# Patient Record
Sex: Female | Born: 1945 | Race: Black or African American | Hispanic: No | Smoking: Never smoker
Health system: Southern US, Community
[De-identification: ages and names within clinical notes are randomized; demographics above are authoritative.]

## PROBLEM LIST (undated history)

## (undated) DIAGNOSIS — M1990 Unspecified osteoarthritis, unspecified site: Secondary | ICD-10-CM

## (undated) DIAGNOSIS — I671 Cerebral aneurysm, nonruptured: Secondary | ICD-10-CM

## (undated) DIAGNOSIS — I10 Essential (primary) hypertension: Secondary | ICD-10-CM

## (undated) DIAGNOSIS — E039 Hypothyroidism, unspecified: Secondary | ICD-10-CM

## (undated) DIAGNOSIS — I729 Aneurysm of unspecified site: Secondary | ICD-10-CM

## (undated) HISTORY — PX: EYE SURGERY: SHX253

## (undated) HISTORY — PX: ANEURYSM COILING: SHX5349

## (undated) HISTORY — PX: JOINT REPLACEMENT: SHX530

---

## 2000-01-03 ENCOUNTER — Encounter: Admission: RE | Admit: 2000-01-03 | Discharge: 2000-01-03 | Payer: Self-pay | Admitting: Family Medicine

## 2000-01-03 ENCOUNTER — Encounter: Payer: Self-pay | Admitting: Family Medicine

## 2000-04-02 ENCOUNTER — Encounter: Payer: Self-pay | Admitting: Family Medicine

## 2000-04-02 ENCOUNTER — Encounter: Admission: RE | Admit: 2000-04-02 | Discharge: 2000-04-02 | Payer: Self-pay | Admitting: Family Medicine

## 2000-04-24 ENCOUNTER — Ambulatory Visit (HOSPITAL_COMMUNITY): Admission: RE | Admit: 2000-04-24 | Discharge: 2000-04-24 | Payer: Self-pay | Admitting: Neurosurgery

## 2000-06-25 ENCOUNTER — Inpatient Hospital Stay (HOSPITAL_COMMUNITY): Admission: RE | Admit: 2000-06-25 | Discharge: 2000-06-27 | Payer: Self-pay | Admitting: Interventional Radiology

## 2001-05-11 ENCOUNTER — Encounter: Payer: Self-pay | Admitting: Family Medicine

## 2001-05-11 ENCOUNTER — Encounter: Admission: RE | Admit: 2001-05-11 | Discharge: 2001-05-11 | Payer: Self-pay | Admitting: Family Medicine

## 2001-06-30 ENCOUNTER — Ambulatory Visit (HOSPITAL_COMMUNITY): Admission: RE | Admit: 2001-06-30 | Discharge: 2001-06-30 | Payer: Self-pay | Admitting: Interventional Radiology

## 2001-08-30 ENCOUNTER — Ambulatory Visit (HOSPITAL_COMMUNITY): Admission: RE | Admit: 2001-08-30 | Discharge: 2001-08-30 | Payer: Self-pay | Admitting: Neurosurgery

## 2002-05-27 ENCOUNTER — Encounter: Admission: RE | Admit: 2002-05-27 | Discharge: 2002-05-27 | Payer: Self-pay | Admitting: Family Medicine

## 2002-05-27 ENCOUNTER — Encounter: Payer: Self-pay | Admitting: Family Medicine

## 2002-06-08 ENCOUNTER — Encounter (INDEPENDENT_AMBULATORY_CARE_PROVIDER_SITE_OTHER): Payer: Self-pay | Admitting: Specialist

## 2002-06-08 ENCOUNTER — Ambulatory Visit (HOSPITAL_BASED_OUTPATIENT_CLINIC_OR_DEPARTMENT_OTHER): Admission: RE | Admit: 2002-06-08 | Discharge: 2002-06-08 | Payer: Self-pay | Admitting: Orthopedic Surgery

## 2003-06-29 ENCOUNTER — Encounter: Payer: Self-pay | Admitting: Family Medicine

## 2003-06-29 ENCOUNTER — Encounter: Admission: RE | Admit: 2003-06-29 | Discharge: 2003-06-29 | Payer: Self-pay | Admitting: Family Medicine

## 2003-10-05 ENCOUNTER — Ambulatory Visit (HOSPITAL_COMMUNITY): Admission: RE | Admit: 2003-10-05 | Discharge: 2003-10-05 | Payer: Self-pay | Admitting: Gastroenterology

## 2004-07-17 ENCOUNTER — Encounter: Admission: RE | Admit: 2004-07-17 | Discharge: 2004-07-17 | Payer: Self-pay | Admitting: Family Medicine

## 2005-06-11 ENCOUNTER — Other Ambulatory Visit: Admission: RE | Admit: 2005-06-11 | Discharge: 2005-06-11 | Payer: Self-pay | Admitting: Family Medicine

## 2005-08-24 ENCOUNTER — Encounter: Admission: RE | Admit: 2005-08-24 | Discharge: 2005-08-24 | Payer: Self-pay | Admitting: Neurosurgery

## 2005-09-08 ENCOUNTER — Encounter: Payer: Self-pay | Admitting: Interventional Radiology

## 2005-10-23 ENCOUNTER — Ambulatory Visit (HOSPITAL_COMMUNITY): Admit: 2005-10-23 | Discharge: 2005-10-24 | Payer: Self-pay | Admitting: Interventional Radiology

## 2005-11-10 ENCOUNTER — Encounter: Payer: Self-pay | Admitting: Interventional Radiology

## 2006-02-20 ENCOUNTER — Ambulatory Visit (HOSPITAL_COMMUNITY): Admission: RE | Admit: 2006-02-20 | Discharge: 2006-02-20 | Payer: Self-pay | Admitting: Interventional Radiology

## 2006-07-23 ENCOUNTER — Encounter: Admission: RE | Admit: 2006-07-23 | Discharge: 2006-07-23 | Payer: Self-pay | Admitting: Family Medicine

## 2006-09-23 ENCOUNTER — Inpatient Hospital Stay (HOSPITAL_COMMUNITY): Admission: RE | Admit: 2006-09-23 | Discharge: 2006-09-25 | Payer: Self-pay | Admitting: Orthopedic Surgery

## 2007-07-26 ENCOUNTER — Encounter: Admission: RE | Admit: 2007-07-26 | Discharge: 2007-07-26 | Payer: Self-pay | Admitting: Family Medicine

## 2008-02-22 ENCOUNTER — Other Ambulatory Visit: Admission: RE | Admit: 2008-02-22 | Discharge: 2008-02-22 | Payer: Self-pay | Admitting: Family Medicine

## 2008-07-26 ENCOUNTER — Encounter: Admission: RE | Admit: 2008-07-26 | Discharge: 2008-07-26 | Payer: Self-pay | Admitting: Family Medicine

## 2008-10-16 ENCOUNTER — Inpatient Hospital Stay (HOSPITAL_COMMUNITY): Admission: RE | Admit: 2008-10-16 | Discharge: 2008-10-20 | Payer: Self-pay | Admitting: Orthopedic Surgery

## 2008-10-19 ENCOUNTER — Ambulatory Visit: Payer: Self-pay | Admitting: Internal Medicine

## 2009-10-18 ENCOUNTER — Encounter: Admission: RE | Admit: 2009-10-18 | Discharge: 2009-10-18 | Payer: Self-pay | Admitting: Family Medicine

## 2010-10-21 ENCOUNTER — Encounter
Admission: RE | Admit: 2010-10-21 | Discharge: 2010-10-21 | Payer: Self-pay | Source: Home / Self Care | Attending: Family Medicine | Admitting: Family Medicine

## 2011-01-27 LAB — CBC
HCT: 26.6 % — ABNORMAL LOW (ref 36.0–46.0)
HCT: 27.6 % — ABNORMAL LOW (ref 36.0–46.0)
HCT: 29.1 % — ABNORMAL LOW (ref 36.0–46.0)
Hemoglobin: 9.1 g/dL — ABNORMAL LOW (ref 12.0–15.0)
Hemoglobin: 9.7 g/dL — ABNORMAL LOW (ref 12.0–15.0)
MCHC: 32.8 g/dL (ref 30.0–36.0)
MCHC: 33.4 g/dL (ref 30.0–36.0)
MCV: 91 fL (ref 78.0–100.0)
MCV: 92 fL (ref 78.0–100.0)
MCV: 94.1 fL (ref 78.0–100.0)
Platelets: 84 10*3/uL — ABNORMAL LOW (ref 150–400)
Platelets: 93 10*3/uL — ABNORMAL LOW (ref 150–400)
RBC: 2.92 MIL/uL — ABNORMAL LOW (ref 3.87–5.11)
RBC: 2.94 MIL/uL — ABNORMAL LOW (ref 3.87–5.11)
RDW: 13.5 % (ref 11.5–15.5)
RDW: 13.6 % (ref 11.5–15.5)
RDW: 14.6 % (ref 11.5–15.5)
WBC: 6.2 10*3/uL (ref 4.0–10.5)
WBC: 7.9 10*3/uL (ref 4.0–10.5)

## 2011-01-27 LAB — BASIC METABOLIC PANEL
BUN: 12 mg/dL (ref 6–23)
BUN: 8 mg/dL (ref 6–23)
CO2: 26 mEq/L (ref 19–32)
CO2: 27 mEq/L (ref 19–32)
Calcium: 8 mg/dL — ABNORMAL LOW (ref 8.4–10.5)
Calcium: 8.5 mg/dL (ref 8.4–10.5)
Calcium: 9 mg/dL (ref 8.4–10.5)
Chloride: 102 mEq/L (ref 96–112)
Chloride: 108 mEq/L (ref 96–112)
Creatinine, Ser: 0.89 mg/dL (ref 0.4–1.2)
GFR calc Af Amer: >60 mL/min (ref >60)
GFR calc Af Amer: >60 mL/min (ref >60)
GFR calc non Af Amer: 50 mL/min — ABNORMAL LOW (ref >60)
GFR calc non Af Amer: >60 mL/min (ref >60)
GFR calc non Af Amer: >60 mL/min (ref >60)
Glucose, Bld: 107 mg/dL — ABNORMAL HIGH (ref 70–99)
Glucose, Bld: 116 mg/dL — ABNORMAL HIGH (ref 70–99)
Glucose, Bld: 124 mg/dL — ABNORMAL HIGH (ref 70–99)
Potassium: 3.4 mEq/L — ABNORMAL LOW (ref 3.5–5.1)
Potassium: 4.2 mEq/L (ref 3.5–5.1)
Sodium: 134 mEq/L — ABNORMAL LOW (ref 135–145)
Sodium: 134 mEq/L — ABNORMAL LOW (ref 135–145)
Sodium: 137 mEq/L (ref 135–145)
Sodium: 141 mEq/L (ref 135–145)

## 2011-01-27 LAB — GLUCOSE, CAPILLARY
Glucose-Capillary: 119 mg/dL — ABNORMAL HIGH (ref 70–99)
Glucose-Capillary: 119 mg/dL — ABNORMAL HIGH (ref 70–99)

## 2011-01-27 LAB — CULTURE, BLOOD (ROUTINE X 2): Culture: NO GROWTH

## 2011-01-27 LAB — TYPE AND SCREEN
ABO/RH(D): A POS
Antibody Screen: NEGATIVE

## 2011-01-27 LAB — DIC (DISSEMINATED INTRAVASCULAR COAGULATION)PANEL
D-Dimer, Quant: 4.9 ug/mL-FEU — ABNORMAL HIGH (ref 0.00–0.48)
Prothrombin Time: 18.3 seconds — ABNORMAL HIGH (ref 11.6–15.2)
aPTT: 44 seconds — ABNORMAL HIGH (ref 24–37)

## 2011-01-27 LAB — PROTIME-INR
INR: 1.4 (ref 0.00–1.49)
INR: 1.5 (ref 0.00–1.49)
INR: 1.7 — ABNORMAL HIGH (ref 0.00–1.49)
INR: 1.9 — ABNORMAL HIGH (ref 0.00–1.49)
Prothrombin Time: 17.4 seconds — ABNORMAL HIGH (ref 11.6–15.2)

## 2011-01-27 LAB — URINE CULTURE: Colony Count: 15000

## 2011-01-27 LAB — HEPARIN ANTIBODY SCREEN: Heparin Antibody Screen: NEGATIVE

## 2011-01-27 LAB — URINALYSIS, ROUTINE W REFLEX MICROSCOPIC
Bilirubin Urine: NEGATIVE
Ketones, ur: NEGATIVE mg/dL
Nitrite: NEGATIVE
Urobilinogen, UA: 0.2 mg/dL (ref 0.0–1.0)
pH: 5.5 (ref 5.0–8.0)

## 2011-01-27 LAB — HEMOGLOBIN A1C: Mean Plasma Glucose: 97 mg/dL

## 2011-02-25 NOTE — Consult Note (Signed)
Meredith [name], Meredith [name]              ACCOUNT NO.:  [id number]   MEDICAL RECORD NO.:  [id number]          PATIENT TYPE:  INP   LOCATION:  5031                         FACILITY:  [facility]   PHYSICIAN:  Lajuana Matte, MD  DATE OF BIRTH:  12/28/1945   DATE OF CONSULTATION:  10/19/2008  DATE OF DISCHARGE:                                 CONSULTATION   REASON FOR CONSULTATION:  Thrombocytopenia.   HISTORY OF PRESENT ILLNESS:  Meredith [name] is a pleasant 65 year old  African American female admitted for right total knee replacement.  On  admission her platelet counts were 93,000 but started to decline down to  77,000 earlier today.  A CBC on October 10, 2008, demonstrated a  platelet count of 172,000.  She was started on Lovenox after the surgery  but this was discontinued 2 days ago and switched to Coumadin alone.  She is currently asymptomatic, except for an area of ecchymosis on the  right arm after starting IV access.  The patient has similar episodes of  thrombocytopenia after a left knee replacement in the year 2007 and she  recovered spontaneously.  Her HIT test was negative and her DIC panel is  essentially unremarkable.  We were asked to see her with recommendations  regarding her thrombocytopenia.   PAST MEDICAL HISTORY:  1. Prior history of cerebral aneurysm in the years 2006 and 2001.  2. Hypertension.  3. Hypothyroidism.  4. Obesity.  5. DJD - osteoarthritis.  6. Schizophrenia.   PAST SURGICAL HISTORY:  1. Status post left TKR in the year 2007, Dr. Thurston Hole.  2. Status post arterial coiling for cerebral aneurysm in the years      2006 and 2001.  3. Status post right TKR October 16, 2008, Dr. Thurston Hole.   ALLERGIES:  NKDA.   MEDICATIONS:  1. Avelox 400 mg IV daily, day 1.  2. Colace 100 mg b.i.d.  3. NovoLog as directed.  4. Synthroid 200 mg (sic) daily.  5. K-Dur 40 mEq b.i.d.  6. Zocor 80 mg daily.  7. Robaxin 500 mg q.6 h p.r.n.  8. Percocet q.4 h p.r.n.  9. Phenergan  12.5 mg q.6 hours p.r.n.  10.Senokot p.r.n.  11.Temazepam 15-30 mg q.h.s. p.r.n.   REVIEW OF SYSTEMS:  Remarkable for fever up to 101.2 as of January 5,  now improved.  No chills.  No night sweats.  No headaches.  She denies  any increased confusion.  No vision changes.  She denies any dysphagia,  dyspnea on exertion, shortness of breath or cough.  No abdominal pain.  No failure to thrive or weight loss.  No GERD symptoms.  No nausea,  vomiting, diarrhea or constipation.  No blood in the stools.  No dark  stools.  Denies any back pain.  Denies any numbness or swelling.  She  has some fatigue.  Of note, she fell in the hospital, in the area of  opening incision on October 18, 2008, for which she is at this time  tender in the area, as well as swollen.   FAMILY HISTORY:  Mother's history unknown to the patient,  father died  with MI.  She has one sibling who died with cancer of unknown type and  one sister who died of kidney disease.   SOCIAL HISTORY:  The patient is married.  She has one child.  No tobacco  or alcohol history.  She is retired from [employer] school system.  Lives in [place].  [employer].  On health maintenance, her last  colonoscopy was performed by Dr. Danise Edge in 2004 and was  negative.  Last mammogram October 2009, negative.   PHYSICAL EXAMINATION:  GENERAL:  This is a 65 year old African American  female in no acute distress, alert and oriented x3.  Looks older than  her stated age.  VITAL SIGNS:  Blood pressure 118/63, pulse 96, respirations 20,  temperature 99.4, pulse oximetry 89% on room air, changed to 92% on 2  liters, weight 104 kilograms, height 62 inches.  HEENT:  Normocephalic, atraumatic.  PERRLA.  Oral mucosa without thrush  or lesions.  NECK:  Supple.  No cervical or supraclavicular masses.  LUNGS:  Clear to auscultation with the exception of a trace of crackles  at the bases.  No axillary masses.  CARDIOVASCULAR:  Regular rate and rhythm  without murmurs, rubs or  gallops, occasional ectopic beat.  ABDOMEN:  Soft, nontender.  Bowel sounds x4.  No hepatosplenomegaly.  EXTREMITIES:  With no clubbing or cyanosis.  No significant edema in the  nonsurgical leg.  Mild swelling in the right knee area where the surgery  took place.  There is some bruising in the venipuncture site but no  petechial rash.  No other areas of acute bleeding.  BREASTS:  Not examined.  GU:  Deferred.  RECTAL:  Deferred.  MUSCULOSKELETAL:  With no spinal tenderness.  NEUROLOGICAL:  Essentially nonfocal, the patient is walking with  assistance now secondary to the postsurgical changes.  However, she can  move all four extremities and can follow command without difficulty.   LABS:  Hemoglobin 9.7, hematocrit 29.1, white count 10.1, platelets 77,  MCV 92.  ANC white count of 3.8 with 2.4, monocytes 0.2, lymphocytes  1.2.  LDH is 226 normal.  DIC shows a platelet count of 87.  No  schistocytes seen.  Fibrinogen 621, D-dimer 490, PTT is 44, INR 1.5, PT  18.3.  The HIT screen today is negative.  Sodium 137, potassium 5.0, BUN  9, creatinine 0.94, glucose 107, total bilirubin 1.4, ALT 88, AST 24,  ALT 21, total protein 7.3.  Albumin 4.0, calcium 9.0.  HIT screen  negative.  A1c 5.0.  Urinalysis negative.  Blood cultures negative.  Chest x-ray on December 29 negative.   ASSESSMENT AND PLAN:  Dr. Arbutus Ped has seen and evaluated the patient and  reviewed the chart.  This is a pleasant 65 year old woman we are asked  to see for evaluation of thrombocytopenia in the setting of a right knee  replacement on October 16, 2008.  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  Recommend observation for now.  Monitor the platelet  counts daily until her counts  are greater than 100,000.  If the platelet count continues to decline  will re-evaluate the patient back and proceed with further   recommendations.   Thank you very much for allowing [place] the opportunity to participate in  the care of Meredith [name].      Marlowe Kays, P.A.      Lajuana Matte, MD  Electronically Signed  SW/MEDQ  D:  10/19/2008  T:  10/19/2008  Job:  [id number]

## 2011-02-25 NOTE — Op Note (Signed)
NAMESHARRON, Meredith [name]              ACCOUNT NO.:  [id number]   MEDICAL RECORD NO.:  [id number]          PATIENT TYPE:  INP   LOCATION:  2899                         FACILITY:  [facility]   PHYSICIAN:  Elana Alm. Thurston Hole, M.D. DATE OF BIRTH:  08/16/1946   DATE OF PROCEDURE:  10/16/2008  DATE OF DISCHARGE:                               OPERATIVE REPORT   PREOPERATIVE DIAGNOSIS:  Right knee degenerative joint disease.   POSTOPERATIVE DIAGNOSIS:  Right knee degenerative joint disease.   PROCEDURES:  1. Right total knee replacement using DePuy cemented total knee system      with #3 MTB.  2. Cemented tibia with 5-mm augmentations.  3. Cemented femur.  4. A 15-mm polyethelene RP tibial spacer.  5. A 35-mm polyethelene cemented patella.   SURGEON:  Elana Alm. Thurston Hole, MD   ASSISTANT:  Julien Girt, PA   ANESTHESIA:  General.   OPERATIVE TIME:  One hour and 45 minutes.   COMPLICATIONS:  None.   DESCRIPTION OF PROCEDURE:  Meredith [name] was brought to the operating room  on October 16, 2008, after a femoral nerve block was placed in the  holding area by Anesthesia.  She was placed on the operating table in  supine position.  After being placed under general anesthesia, she had a  Foley catheter placed under sterile conditions.  She received Ancef 2 g  IV preoperatively for prophylaxis.  Her right knee was examined.  Range  of motion from -5 to 115 degrees with moderate varus deformity and a  stable ligamentous exam with normal patellar tracking.  The right leg  was prepped using sterile DuraPrep and draped using sterile technique.  Time-out was used to identify the correct leg, surgeon, and procedure.  The right leg was then exsanguinated and a thigh tourniquet elevated to  375 mm.  Initially, through a 15-cm longitudinal incision based over the  patella, initial exposure was made.  The underlying subcutaneous tissues  were incised along the skin incision.  A median arthrotomy was  performed  revealing an excessive amount of normal-appearing joint fluid.  The  articular surfaces were inspected.  She had grade 4 changes medially,  laterally, and in the patellofemoral joint with significant erosion on  the medial tibial plateau.  Large osteophytes removed from the femoral  condyles and tibial plateau.  There were significant hypertrophic almost  rheumatoid-appearing synovitis in the suprapatellar pouch and medial and  lateral gutters and this was slowly resected.  After this was done, an  intramedullary drill was drilled up the femoral canal for placement of  distal femoral cutting jig, which was placed in the appropriate amount  of rotation and a distal 13-mm cut was made.  The distal femur was then  sized.  The #3 was found to be the appropriate size.  The #3 cutting jig  was placed in the appropriate amount of external rotation and then these  cuts were made.  Proximal tibia was then exposed.  The tibial spines  were removed with an oscillating saw.  An intramedullary drill was  drilled down the tibial canal for placement of  proximal tibial cutting  jig, which was placed in the appropriate amount of rotation and a  proximal 6-mm cut was made, based off the medial or lower side taking  approximately 13 or 14-mm off the lateral side.  Spacer blocks were  placed in flexion and extension.  A 17-mm blocks gave excellent  stability and excellent correction of her flexion and varus deformities.  At this point, the proximal tibia was exposed and then a #3 MTB drill  was used to drill the proximal tibia.  This MTB sized and the tibia was  used due to the significant medial erosion and the fact that there had  to be significant increased lateral tibial bone removed to gain  balancing and stability.  After this was done, then the PCL box cutter  was placed on the distal femur and these cuts were made.  At this point,  the #3 tibial baseplate with 5-mm augments was used to  raise a joint  line upto normal and then the #3 femoral component trial was placed with  a 15-mm polyethelene RP tibial spacer, the knee was reduced taken  through range of motion from 0 to 120 degrees with excellent stability  and excellent correction of her flexion and varus deformities.  Normal  patellar tracking noted as well.  Resurfacing 9-mm cut was then made and  3 locking holes placed for a 35-mm patella.  The patellar trial was in  place and again patellofemoral tracking was evaluated and found to be  normal.  As this was done, it was felt that all the trial components  were of excellent size, fit, and stability.  They were then removed.  The knee was then jet lavaged, irrigated with 3 L of saline.  The  proximal tibia was then exposed and a #3 MTB tibia with 5-mm augments  with Zinacef 1.5 g antibiotic cement was placed and then the tibia  hammered in position with an excellent fit with excess cement being  removed from around the edges.  The #3 femoral component with Zinacef  enhanced cement was placed and hammered into position with an excellent  fit also with excess cement being removed from around the edges.  The 15-  mm polyethylene RP tibial spacer was placed on the tibial base plate.  The knee was reduced take through range of motion from 0 to 120 degrees  with excellent stability and excellent correction of her flexion and  varus deformities.  The 35-mm polyethylene cement backed patella was  then placed in this position and held there with a clamp.  After the  cement hardened, the patellofemoral tracking was evaluated and found to  be normal.  At this point, it was felt that all the components were of  excellent size, fit, and stability.  The wound was further irrigated  with saline.  The tourniquet was then released.  Hemostasis obtained  with cautery.  The arthrotomy was then closed with #1 Ethibond suture  over 2 medium Hemovac drains.  Subcutaneous tissue is closed  with 0 and  2-0 Vicryl, subcuticular layer closed with 4-0 Monocryl.  Sterile  dressings and a long leg splint were then applied.  The patient then  awakened, extubated, and taken to recovery room in stable condition.  Needle and sponge counts correct x2 at the end of case.  Neurovascular  status normal postoperatively as well.      Robert A. Thurston Hole, M.D.  Electronically Signed     RAW/MEDQ  D:  10/16/2008  T:  10/17/2008  Job:  [id number]

## 2011-02-28 NOTE — Op Note (Signed)
NAME:  Meredith [name], Meredith [name]                        ACCOUNT NO.:  [id number]   MEDICAL RECORD NO.:  [id number]                   PATIENT TYPE:  AMB   LOCATION:  DSC                                  FACILITY:  [facility]   PHYSICIAN:  Artist Pais. Mina Marble, M.D.           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   PROCEDURE:  Excisional biopsy mass left index finger.   SURGEON:  Artist Pais. Mina Marble, M.D.   ASSISTANT:  R.N.   ANESTHESIA:  General.   TOURNIQUET TIME:  Forty minutes.   COMPLICATIONS:  None.   DRAINS:  None.   DESCRIPTION OF PROCEDURE:  The patient was taken to the operating room.  After the induction of adequate general anesthesia the left upper extremity  was prepped and draped in the usual sterile fashion.  An Esmarch bandage was  used to exsanguinate the limb, the tourniquet was inflated to 350 mmHg. At  in this point in time a transverse incision was made starting on the dorsal  ulnar aspect of the index finger and going volarly all the way across to the  radial side.  The incision was taken out to the skin subcutaneous tissues.  Dissection was carried through the fascial layer until an obvious giant cell  tumor was encountered.  It had a brownish-orange color and texture  consistent with a probable giant cell tumor of the tendon sheath.  There was  an extremely large tumor involving both the radial and ulnar sides going  across the midline involving both neurovascular bundles.  Both neurovascular  bundles were carefully dissected free from the underlying mass which was  dissected down to the SPL tendon.  The size of the mass was 2 x 3 cm  minimum. After careful dissection the neurovascular bundle was again  identified and retracted.   After the mass was removed it was sent for  pathologic confirmation/diagnosis.  The wound was then thoroughly irrigated,  hemostasis was achieved with bipolar cautery and was closed with 5-0 nylon  with some of the excess skin being excised at wound closure.  The patient  was then placed in a sterile dressing with Xeroform, 4 x 4, fluffs and a  Coban wrap.  The patient tolerated the procedure well and was returned to  the recovery room in a stable fashion.                                                Artist Pais Mina Marble, M.D.    MAW/MEDQ  D:  06/08/2002  T:  06/10/2002  Job:  [id number]

## 2011-02-28 NOTE — Consult Note (Signed)
[name], Meredith [name]              ACCOUNT NO.:  [id number]   MEDICAL RECORD NO.:  [id number]          PATIENT TYPE:  OUT   LOCATION:  XRAY                         FACILITY:  [facility]   PHYSICIAN:  Sanjeev K. Deveshwar, M.D.DATE OF BIRTH:  1946-02-18   DATE OF CONSULTATION:  09/08/2005  DATE OF DISCHARGE:                                   CONSULTATION   ADDENDUM:  Greater than 30 minutes was spent on this consult today.      [name], P.A.    ______________________________  [name], M.D.    DR/MEDQ  D:  09/08/2005  T:  09/08/2005  Job:  [id number]

## 2011-02-28 NOTE — Op Note (Signed)
NAME:  Meredith [name], Meredith [name]                        ACCOUNT NO.:  [id number]   MEDICAL RECORD NO.:  [id number]                   PATIENT TYPE:  AMB   LOCATION:  ENDO                                 FACILITY:  [facility]   PHYSICIAN:  Danise Edge, M.D.                DATE OF BIRTH:  1946/07/13   DATE OF PROCEDURE:  10/05/2003  DATE OF DISCHARGE:                                 OPERATIVE REPORT   PROCEDURE PERFORMED:  Diagnostic colonoscopy.   ENDOSCOPIST:  Charolett Bumpers, M.D.   INDICATIONS FOR PROCEDURE:  [name] is a 65 year old female  born [birth year].  On May 31, 1997 her health maintenance  flexible  proctosigmoidoscopy was normal.  On June 05, 2003 Meredith [name] submitted  stool Hemoccult cards to Dr. August Saucer Mitchell's office for testing.  Two of the  six cards were guaiac positive.  On June 13, 2003, six of six stool guaiac  cards were negative.  Meredith [name] denies a personal or family history of  colon cancer.   MEDICATION ALLERGIES:  None.   CHRONIC MEDICATIONS:  Synthroid.   PAST MEDICAL/SURGICAL HISTORY:  Primary hypothyroidism.  Knee pain managed  by Elana Alm. Thurston Hole, M.D.  Cerebral aneurysm coiling.   FAMILY HISTORY:  Negative for colon cancer.   HABITS:  Meredith [name] does not smoke cigarettes or consume alcohol.   PREMEDICATION:  Versed 5 mg, Demerol 50 mg.   DESCRIPTION OF PROCEDURE:  After obtaining informed consent, Meredith [name] was  placed in the left lateral decubitus position.  I administered intravenous  Demerol and intravenous Versed to achieve conscious sedation for the  procedure.  The patient's blood pressure, oxygen saturations and cardiac  rhythm were monitored throughout the procedure and documented in the medical  record.   Anal inspection was normal.  Digital rectal exam was normal.  The pediatric  Olympus adjustable colonoscope was introduced into the rectum and advanced  to the cecum.  Colonic preparation for the exam today was  excellent.   Rectum:  Normal.   Sigmoid colon and descending colon:  Normal.   Splenic flexure:  Normal.   Transverse colon:  Normal.   Hepatic flexure:  Normal.   Ascending colon:  Normal.   Cecum and ileocecal valve:  Normal.   ASSESSMENT:  Normal diagnostic proctocolonoscopy to the cecum performed to  evaluate guaiac positive stool.  No endoscopic evidence for the presence of  colorectal neoplasia or inflammatory bowel disease.                                               Danise Edge, M.D.    MJ/MEDQ  D:  10/05/2003  T:  10/06/2003  Job:  [id number]   cc:   L. Lupe Carney, M.D.  [address]  [place]  [place] [address]  Fax: [phone]

## 2011-02-28 NOTE — Discharge Summary (Signed)
NAME:  Meredith [name], Meredith [name]              ACCOUNT NO.:  [id number]   MEDICAL RECORD NO.:  [id number]          PATIENT TYPE:  OIB   LOCATION:  3114                         FACILITY:  [facility]   PHYSICIAN:  Sanjeev K. Deveshwar, M.D.DATE OF BIRTH:  1946/07/16   DATE OF ADMISSION:  10/23/2005  DATE OF DISCHARGE:  10/24/2005                                 DISCHARGE SUMMARY   ADDENDUM:  The patient was told to stop her Plavix medication at the time of  discharge from the hospital.  She was to continue aspirin 81 mg for two  weeks or until seen by Dr. Corliss Skains in follow up on Monday, November 10, 2005.      [name], P.A.    ______________________________  [name], M.D.    DR/MEDQ  D:  10/24/2005  T:  10/24/2005  Job:  [id number]

## 2011-02-28 NOTE — Discharge Summary (Signed)
Meredith [name], Meredith [name]              ACCOUNT NO.:  [id number]   MEDICAL RECORD NO.:  [id number]          PATIENT TYPE:  INP   LOCATION:  5031                         FACILITY:  [facility]   PHYSICIAN:  Elana Alm. Thurston Hole, M.D. DATE OF BIRTH:  07-21-1946   DATE OF ADMISSION:  10/16/2008  DATE OF DISCHARGE:  10/20/2008                               DISCHARGE SUMMARY   ADMITTING DIAGNOSES:  1. End-stage degenerative joint disease, right knee.  2. History of cerebral aneurysm on the left side, coiled in 2006.  3. Thyroid disease.  4. Hypertension.  5. Status post left total knee replacement in 2007.   DISCHARGE DIAGNOSES:  1. End-stage degenerative joint disease, right knee.  2. History of cerebral aneurysm, left side, coiled in 2006.  3. Thyroid disease.  4. Hypertension.  5. Status post left total knee.  6. Postoperative blood loss anemia.  7. Hypokalemia.  8. Schizophrenia.   HISTORY OF PRESENT ILLNESS:  The patient is a 65 year old black female  with a history of bilateral knee end-stage DJD.  She failed conservative  care and had a left total knee replacement in 2007, now right knee, has  pain at night, pain at rest, pain unrelieved by anti-inflammatories.  Risks, benefits, and possible complications of surgery have been  discussed with the patient and her husband.  She is without question.   PROCEDURES IN-HOUSE:  On October 16, 2008, the patient underwent a right  total knee replacement by Dr. Thurston Hole, a right femoral nerve block by  Anesthesia, and Autovac transfusion.  She tolerated all procedures well  and was placed on a CPM 0-50 degrees in the recovery room.  On postop  day 1, hemoglobin is 9.1, platelet count is low at 93, T-max of 101,  temperature on exam is 100.6.  Surgical wound is well approximated.  Her  Lovenox was stopped due to her thrombocytopenia.  Her Foley was  discontinued.  Her PCA was discontinued.  Her IV was saline locked.  She  was given potassium  supplementation because her potassium was low at  3.4.  She was given milk of magnesia.  On postop day 2, her hemoglobin  was 8.1, platelet count was down to 84.  Her INR was 1.9 in the morning.  The patient fell while she was getting out, opened her knee with  hyperflexion of the knee, and now has a knee wound.   Wound was copiously washed with bedtime.  Steri-Strips were put in  place.  The patient was instructed not to ambulate without assistance or  her knee immobilizer.  The patient was given 2 units of packed red blood  cells due to postop blood loss anemia.  On postop day 3, hemoglobin was  9.7, still thrombocytopenic with platelet count of 77.  Hemoglobin A1c  is 5.  She is metabolically stable.  Her Ancef and her Keflex were  discontinued.  She was started on Avelox 400 mg IV daily.  Hematology  was consulted for thrombocytopenia.  On postop day 4, T-max of 99.3.  She is on Avelox.  Surgical wound is well approximated, but still  has a  moderate amount of drainage due to fall yesterday.  The patient's  platelet count was significantly improved up to 108.  Her hemoglobin was  9.2.  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  Her INR was  slightly subtherapeutic at 1.9.  She was discharged to home in stable  condition weightbearing as tolerated on a regular diet, told to call  with increased pain, increased swelling, increased redness, or temp  greater than 101.  She has been instructed to change her dressing daily,  swab her wound daily with Betadine, use the CPM 0-90 degrees 8 hours a  day, elevate her heel on a folded pillow.  We will see her back in the  office on October 23, 2008, to check her wound.      Kirstin Shepperson, P.A.      Robert A. Thurston Hole, M.D.  Electronically Signed    KS/MEDQ  D:  11/29/2008  T:  11/30/2008  Job:  [id number]

## 2011-02-28 NOTE — H&P (Signed)
NAME:  Meredith [name], Meredith [name]              ACCOUNT NO.:  [id number]   MEDICAL RECORD NO.:  [id number]          PATIENT TYPE:  AMB   LOCATION:  SDS                          FACILITY:  [facility]   PHYSICIAN:  Sanjeev K. Deveshwar, M.D.DATE OF BIRTH:  Jan 17, 1946   DATE OF ADMISSION:  10/22/2005  DATE OF DISCHARGE:                                HISTORY & PHYSICAL   CHIEF COMPLAINT:  The patient is being admitted for cerebral angiogram and  aneurysm recoiling.   HISTORY OF PRESENT ILLNESS:  This is a pleasant 65 year old female with  history of multiple aneurysms.  She underwent endovascular coiling of a left  supraclinoid internal carotid artery aneurysm by Dr. Corliss Skains on June 25, 2000.  She was noted to have a residual left internal carotid artery  aneurysm as well as two aneurysms in the right internal carotid artery.  The  patient has been followed by Dr. Tressie Stalker, her neurosurgeon.  She  recently had an MRI/MRA performed August 24, 2005, that demonstrated some  recannulization of the previously coiled aneurysm.  The patient met with Dr.  Corliss Skains on September 08, 2005, and arrangements were made to have the  patient return today for repeat intervention.   ALLERGIES:  NO KNOWN DRUG ALLERGIES.   CURRENT MEDICATIONS:  1.  Aspirin 325 mg daily.  2.  HydroDIURIL 25 mg daily.  3.  Indocin 75 mg b.i.d.  4.  Plavix 75 mg daily.  5.  Synthroid, I believe the dose is 200 mcg daily.  6.  Zocor 40 mg daily.   PAST MEDICAL HISTORY:  1.  Hyperlipidemia.  2.  Hypertension.  3.  Hypothyroidism.  4.  Arthritis.   PAST SURGICAL HISTORY:  The patient has had no previous surgeries other than  the above aneurysm coiling.   SOCIAL HISTORY:  Patient is married.  She has one child.  She [place] her [place].  She does not smoke.  She  smoked minimally in the past.  She does not use alcohol.  She works at the  [employer].   FAMILY HISTORY:   The patient's parents' medical history is unknown to the  patient.  They are both deceased.   REVIEW OF SYSTEMS:  Negative except for a history of arthritis.  She has  occasional nose bleeds.  She has a history of hypothyroidism.  The remainder  of the review of systems was negative.   LABORATORY DATA:  An INR is 1.0.  A PTT is 38.  Hemoglobin 12.5, hematocrit  36.8, wbc 3.9000, platelets 212,000.  BUN is 14, creatinine 0.9, potassium  3.7, glucose was mildly elevated at 113.   PHYSICAL EXAMINATION:  GENERAL APPEARANCE:  A pleasant, but quiet, 65-year-  old [place] American female in no acute distress.  VITAL SIGNS:  Blood pressure 115/72, pulse 68, respirations 16, temperature  97.4.  HEENT:  Unremarkable.  NECK:  No bruits, no jugular venous distension.  LUNGS:  Clear.  CARDIOVASCULAR:  Regular rate and rhythm without murmur.  ABDOMEN:  Obese, soft, nontender.  EXTREMITIES:  Pulses intact with trace  edema.  NEUROLOGIC:  Airway is a 4.  ASA scale is a 3.  Mental status:  The patient  is awake, alert and oriented.  Follows commands.  Cranial nerves II-XII  grossly intact.  Sensation is intact to light touch.  Motor strength is 4 to  5/5 throughout, equal and symmetrical.  Cerebellar testing is somewhat slow  but intact.   IMPRESSION:  1.  The patient is being admitted for recoiling of a previously coiled      aneurysm.  2.  History of a supraclinoid internal carotid artery aneurysm coiled in      September of 2001.  3.  History of a residual left internal carotid artery aneurysm as well as      two aneurysms of the right internal carotid artery.  4.  History of hyperlipidemia.  5.  History of history of.  6.  Hypothyroidism.  7.  Arthritis.  8.  Mildly elevated glucose.  9.  Mildly decreased white blood cell count.      [name], P.A.    ______________________________  [name], M.D.    DR/MEDQ  D:  10/23/2005  T:  10/23/2005  Job:  [id number]   cc:    L. Lupe Carney, M.D.  Fax: [phone]   Cristi Loron, M.D.  Fax: [phone]

## 2011-02-28 NOTE — Consult Note (Signed)
Meredith [name], Meredith [name]              ACCOUNT NO.:  [id number]   MEDICAL RECORD NO.:  [id number]          PATIENT TYPE:  OUT   LOCATION:  XRAY                         FACILITY:  [facility]   PHYSICIAN:  Sanjeev K. Deveshwar, M.D.DATE OF BIRTH:  October 02, 1946   DATE OF CONSULTATION:  09/08/2005  DATE OF DISCHARGE:                                   CONSULTATION   CHIEF COMPLAINT:  Cerebral aneurysms.   HISTORY OF PRESENT ILLNESS:  This is a pleasant 65 year old female with a  history of multiple aneurysms. She underwent endovascular coiling of a left  supraclinoid internal carotid artery aneurysm by Dr. Corliss Skains June 25, 2000. She was noted to have a residual left internal carotid artery aneurysm  as well as two aneurysms of the right internal carotid artery. The patient  has been following up with her neurosurgeon, Dr. Tressie Stalker. She  recently had an MRI/MRA performed on August 24, 2005 that demonstrated  some recannulization of the previously coiled aneurysm. The patient is here  today to meet with Dr. Corliss Skains to discuss treatment options.   PAST MEDICAL HISTORY:  Significant for hyperlipidemia, hypertension,  hypothyroidism and arthritis followed by Dr. Fredric Mare.   SURGICAL HISTORY:  The patient has had no previous surgeries.   ALLERGIES:  No known drug allergies.   CURRENT MEDICATIONS:  Synthroid dosage not available, aspirin 325 milligrams  daily, simvastatin 40 milligrams daily, indomethacin 75 milligrams frequency  unknown, hydrochlorothiazide 25 milligrams daily.   SOCIAL HISTORY:  The patient is married. She has one child. She lives in  [place] with her husband. She does not smoke. She had smoked minimally in  the past. She does not use alcohol. She works at the [employer].   FAMILY HISTORY:  The patient's parents medical history is unknown to the  patient. They are both deceased.   IMPRESSION AND PLAN:  As noted, this patient has a history of four  separate  carotid aneurysms one has previously been coiled in 2001 by Dr. Corliss Skains;  this is currently demonstrating signs of recannulization. Dr. Corliss Skains  recommended placing Aneuroform stent and adding additional coils to this  aneurysm. He felt at the same time the other left-sided internal carotid  artery aneurysm could be coiled as well. He has recommended scheduling this  procedure in early January at a time when Dr. Lovell Sheehan would be available for  possible back up if needed.   He has recommended the patient start on Plavix two days prior to the coiling  and remain on Plavix for 1-2 weeks following the procedure. She was given a  prescription for a 30-day supply. The patient was advised of the risks and  benefits. She would like to proceed. We will contact her to schedule this  procedure in early January.      [name], P.A.    ______________________________  [name], M.D.    DR/MEDQ  D:  09/08/2005  T:  09/08/2005  Job:  [id number]   cc:   L. Lupe Carney, M.D.  Fax: [phone]   Cristi Loron, M.D.  Fax: [phone]

## 2011-02-28 NOTE — Discharge Summary (Signed)
Meredith [name], Meredith [name]              ACCOUNT NO.:  [id number]   MEDICAL RECORD NO.:  [id number]          PATIENT TYPE:  INP   LOCATION:  5003                         FACILITY:  [facility]   PHYSICIAN:  Elana Alm. Thurston Hole, M.D. DATE OF BIRTH:  1946-02-18   DATE OF ADMISSION:  09/23/2006  DATE OF DISCHARGE:  09/25/2006                               DISCHARGE SUMMARY   ADMITTING DIAGNOSIS:  1. 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  2. History of cerebral aneurysm x2.  3. Thyroid disease.  4. Hypertension.  5. History of schizophrenia.   DISCHARGE DIAGNOSIS:  1. End-stage degenerative joint disease left knee.  2. Cerebral aneurysm x2 status post coiling.  3. Thyroid disease.  4. Hypertension.  5. History of schizophrenia.  6. Acute postop blood loss anemia.   HISTORY OF PRESENT ILLNESS:  The patient is a 65 year old white female  with a history of end-stage DJD of her left knee.  She has failed  conservative care including anti-inflammatories and articular cortisone  injections.  She understands risks, benefits and possible complications  of the left total knee replacement and is without question.   PROCEDURES IN HOUSE:  On September 23, 2006 the patient underwent a left  total knee replacement by Dr. Thurston Hole.  She tolerated the procedures  well.  She also had a femoral nerve block by anesthesia.   HOSPITAL COURSE:  She was admitted postoperatively for pain control, DVT  prophylaxis and physical therapy.  On postop day #1 she had episode of  acute postop blood loss anemia with hemoglobin of 7.8, T max of 102.4.  She was metabolically stable.  Her INR was 1.3.  She was typed and  crossed for 2 units, transfused these with 20 mg of Lasix between units.  She was also given 40 mEq of potassium p.o.  She was given milk of  magnesia to prevent constipation.  She was up and mobile with physical  therapy.  Postop day #2 the patient was doing well.  She had had a bowel  movement and  walked to the nurse's station.  T-max of 101.1.  Platelets  were low at 88.  She had a moderate amount of drainage from her knee  with significant ecchymosis but knee wound was well-approximated.  She  was discharged to home in stable condition, weightbearing as tolerated  on a regular diet.   Discharge medications:  Oxycodone 5 mg one to two q. 4 to 6 hours p.r.n.  pain, methocarbamol 500 mg 1 tablet every 4 to 6 hours p.r.n. muscle  spasms, Coumadin 5 mg daily, Synthroid 200 mcg daily,  hydrochlorothiazide 25 mg daily, Keflex 500 mg 1 tablet four times a  day, Colace 100 mg 1 tablet twice a day.   She will use her CPM 0 to 90 degrees 8 hours a day.  She will elevate  her left heel on a folded pillow for 30 minutes every morning.  She has  [facility] for formal physical therapy and monitoring her  PT/INR.  She will  follow up with Dr. Thurston Hole on October 07, 2006.  She  will do daily  dressing changes, call with increased pain, increased redness or  temperature greater than 101.  She is instructed no driving or lifting  for 4 weeks.  She may shower on October 01, 2006.      Kirstin Shepperson, P.A.      Robert A. Thurston Hole, M.D.  Electronically Signed    KS/MEDQ  D:  11/12/2006  T:  11/12/2006  Job:  [id number]

## 2011-02-28 NOTE — Discharge Summary (Signed)
NAME:  Meredith [name], JACUINDE              ACCOUNT NO.:  [id number]   MEDICAL RECORD NO.:  [id number]          PATIENT TYPE:  OIB   LOCATION:  3114                         FACILITY:  [facility]   PHYSICIAN:  Sanjeev K. Deveshwar, M.D.DATE OF BIRTH:  [birth year]   DATE OF ADMISSION:  10/23/2005  DATE OF DISCHARGE:  10/24/2005                                 DISCHARGE SUMMARY   BRIEF HISTORY:  This is a very pleasant, 65 year old female with a history  of multiple aneurysms.  The patient previously underwent endovascular  coiling of a left supraclinoid internal carotid artery by Dr. Corliss Skains on  June 25, 2000.  She was noted to have a residual left internal carotid  artery aneurysm as well as two aneurysms in the right internal carotid  artery.  The patient has been followed by Dr. Tressie Stalker, her  neurosurgeon.  She recently had an MRI/MRA performed August 24, 2005.  It  demonstrated some recannulization of the previously coiled aneurysm and the  patient met with Dr. Corliss Skains on September 08, 2005 and arrangements were  made to have the patient return on 10/23/2005 for a repeat intervention.   PAST MEDICAL HISTORY:  Significant for hyperlipidemia, hypertension,  hypothyroidism, and arthritis. She has had no previous surgeries.   ALLERGIES:  No known drug allergies.   MEDICATIONS:  1.  At the time of admission included aspirin 325 mg daily.  2.  HydroDIURIL 25 mg daily.  3.  Indocin 75 mg b.i.d.  4.  Plavix 75 mg daily.  5.  Synthroid, I think, that the dose is 200 mcg daily.  6.  Zocor 40 mg daily.   SOCIAL HISTORY:  The patient is married.  She has one child.  She [place].  She does not smoke.  She  smoked minimally in the past.  She does not use alcohol.  She works at the  [employer].   FAMILY HISTORY:  The patient's parents medical history is unknown to the  patient.  Both parents are deceased.   HOSPITAL COURSE:   As noted, this patient was admitted to [facility]  on 10/23/2005 to undergo repeat coiling of a previously coiled aneurysm  which had partially recannulized.  The original plan was to place a  Aneuroform stent; however, this was attempted and was unable to be  performed.  Dr. Corliss Skains added additional coils to the previously coiled  aneurysm and felt that he had successfully obliterated the aneurysm.  The  patient was admitted to the neural intensive care unit where she was  monitored overnight on heparin.  The following day the heparin was  discontinued and the right femoral sheath was removed.  Hemostasis was  obtained.  The patient remained on bed rest for the next 6 hours and  arrangements were made to discharge the patient later that evening.   The patient did have some mild problems with elevated blood pressures.  Dr.  Corliss Skains had wanted her systolic blood pressure to be less than 160;  however, at times it was higher than this.  This was treated with  intravenous Apresoline as well as a Nipride drip.  The patient's blood  pressure was stable at discharge.  She was also noted to have a mildly low  potassium of 3.3 on the day of discharge and this was supplemented.   LABORATORY DATA:  On the day of discharge BUN was 8, creatinine was 0.8,  potassium was 3.3.  As noted, this was supplemented.  A CBC on the day of  discharge revealed hemoglobin of 9.6, hematocrit 28.1.  WBCs were 3800,  platelets were 169,000.  On admission hemoglobin had been 12.5, hematocrit  36.8, WBC 3.9.  Platelets 212,000.   The patient was told to resume her home medications.  She was to continue on  her previous diet.  She was not to drive or do anything strenuous or return  to work for at least 2 weeks. A followup angiogram was planned for  approximately 3 months.  The patient will be seen in followup by Dr.  Corliss Skains Monday, November 10, 2005 at 3 p.m.   PROBLEM LIST AT THE TIME OF DISCHARGE:   1.  Recoiling of a previously coiled aneurysm.  2.  Endovascular coiling of the left supraclinoid internal carotid artery      aneurysm on June 25, 2000.  3.  Residual aneurysms of the left internal carotid artery, as well as two      aneurysms in the right internal carotid artery.  4.  History of hyperlipidemia.  5.  History of hypertension.  6.  History of hypothyroidism.  7.  History of arthritis.  8.  Mildly low potassium supplemented.  9.  Mildly low white blood cell count.  10. Anemia following her procedure.      [name], P.A.    ______________________________  [name], M.D.    DR/MEDQ  D:  10/24/2005  T:  10/24/2005  Job:  [id number]   cc:   L. Lupe Carney, M.D.  Fax: [phone]   Cristi Loron, M.D.  Fax: [phone]

## 2011-02-28 NOTE — Op Note (Signed)
Meredith [name], Meredith [name]              ACCOUNT NO.:  [id number]   MEDICAL RECORD NO.:  [id number]          PATIENT TYPE:  INP   LOCATION:  2899                         FACILITY:  [facility]   PHYSICIAN:  Elana Alm. Thurston Hole, M.D. DATE OF BIRTH:  Apr 12, 1946   DATE OF PROCEDURE:  09/23/2006  DATE OF DISCHARGE:                               OPERATIVE REPORT   PREOPERATIVE DIAGNOSIS:  Left knee degenerative joint disease.   POSTOPERATIVE DIAGNOSIS:  Left knee degenerative joint disease.   PROCEDURE:  Left total knee replacement using DePuy cemented total knee  system with #3 cemented femur, #3 cemented tibia, with 15 mm  polyethylene RP tibial spacer and 35 mm polyethylene cemented patella.   SURGEON:  Elana Alm. Thurston Hole, M.D.   ASSISTANT:  Julien Girt, P.A.   ANESTHESIA:  General.   OPERATIVE TIME:  1 hour 30 minutes.   COMPLICATIONS:  None.   DESCRIPTION OF PROCEDURE:  Meredith [name] was brought to operating room on  09/23/2006, placed on operative table supine position.  After being  placed under general anesthesia she had a Foley catheter placed under  sterile conditions.  She received Ancef 2 grams IV preoperatively for  prophylaxis.  Her left knee was examined.  Range of motion from minus 5  to 125 degrees with mild varus deformity, knee stable ligamentous exam  with normal patellar tracking.  Left leg was prepped using sterile  DuraPrep and draped using sterile technique.  Leg was exsanguinated and  thigh tourniquet elevated to 375 mm.  Initially through a 15 cm  longitudinal incision based over the patella, initial exposure was made.  Underlying subcutaneous tissues were incised along with skin incision.  A median arthrotomy was performed revealing an excessive amount of  normal-appearing joint fluid.  The articular surfaces were inspected.  She had grade 4 changes medially, grade 3 changes laterally and grade 3  and 4 changes in the patellofemoral joint.  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  This was  partially resected.  The medial and lateral meniscal remnants were  removed as well as the anterior cruciate ligament.  Osteophytes removed  from the femoral condyles and tibial plateau.  Intramedullary drill was  then drilled up the femoral canal for placement distal femoral cutting  jig which was placed in the appropriate amount of rotation and the  distal 12 mm cut was made.  The distal femur was incised.  #3 was found  to be the appropriate size.  #3 cutting jig was placed and then these  cuts were made.  The proximal tibia was exposed.  The tibial spines were  removed with an oscillating saw.  Intramedullary drill was drilled down  the tibial canal for placement of proximal tibial cutting jig which was  placed in the appropriate amount of rotation and a proximal 6 mm cut was  made based off the medial or lower side.  Spacer blocks were then placed  in flexion and extension.  15 mm block gave excellent balancing,  excellent correction of her flexion and varus deformity.  After this was  done, the proximal tibia was exposed.  #3 tibial base plate was placed  and then the keel cut was made.  The PCL box cutter was then placed on  the distal femur and these cuts were made.  After this was done then the  #3 femoral trial was placed with the #3 tibial base plate trial and the  15 mm polyethylene RP tibial spacer, the knee was reduced, taken through  range of motion from 0 to 125 degrees with excellent stability and  excellent correction of her flexion and varus deformities.  A  resurfacing 10 mm cut was then made on the patella and three locking  holes placed for a 35 mm patellar trial which was placed.  Patellofemoral tracking was then evaluated and found to be normal.  At  this point it is felt that all components were excellent size, fit and  stability.  They were then removed and the knee was jet lavage  irrigated  with 3 liters of saline.  The proximal tibia was exposed.  #3 tibial  baseplate with cement backing was hammered into position with an  excellent fit with excess cement being removed from around the edges.  #3 femoral component with cement backing was hammered into position also  with an excellent fit with excess cement being removed from around the  edges.  The 15 mm polyethylene RP tibial spacer was placed on tibial  baseplate.  The knee reduced, taken through range of motion from 0 to  125 degrees with excellent stability and excellent correction of her  flexion and varus deformities.  The 35 mm polyethylene cement backed  patella was then placed in its position and held there with a clamp.  After the cement hardened, patellofemoral tracking was again evaluated.  This was found be normal.  At this point it is felt that all components  were excellent size and stability.  The wound was further irrigated.  The tourniquet was released.  Hemostasis obtained with cautery.  The  arthrotomy was then closed with #1 Ethibond suture over two medium  Hemovac drains.  Subcutaneous tissues closed with 0 and 2-0 Vicryl.  Subcuticular layer closed with 4-0 Monocryl.  Steri-Strips were applied.  Sterile dressings and a long-leg splint applied.  The patient then had a  femoral nerve block placed by anesthesia for postoperative pain control.  She was then awakened, extubated, taken to recovery room in stable  condition.  Needle, sponge counts correct x2 end of the case.      Robert A. Thurston Hole, M.D.  Electronically Signed     RAW/MEDQ  D:  09/23/2006  T:  09/23/2006  Job:  [id number]

## 2011-07-17 LAB — COMPREHENSIVE METABOLIC PANEL WITH GFR
ALT: 21 U/L (ref 0–35)
AST: 24 U/L (ref 0–37)
Albumin: 4 g/dL (ref 3.5–5.2)
Alkaline Phosphatase: 88 U/L (ref 39–117)
BUN: 11 mg/dL (ref 6–23)
CO2: 27 meq/L (ref 19–32)
Calcium: 9.7 mg/dL (ref 8.4–10.5)
Chloride: 106 meq/L (ref 96–112)
Creatinine, Ser: 0.8 mg/dL (ref 0.4–1.2)
GFR calc non Af Amer: >60 mL/min
Glucose, Bld: 96 mg/dL (ref 70–99)
Potassium: 3.7 meq/L (ref 3.5–5.1)
Sodium: 140 meq/L (ref 135–145)
Total Bilirubin: 1.4 mg/dL — ABNORMAL HIGH (ref 0.3–1.2)
Total Protein: 7.3 g/dL (ref 6.0–8.3)

## 2011-07-17 LAB — URINALYSIS, ROUTINE W REFLEX MICROSCOPIC
Bilirubin Urine: NEGATIVE
Glucose, UA: NEGATIVE mg/dL
Hgb urine dipstick: NEGATIVE
Ketones, ur: NEGATIVE mg/dL
Nitrite: NEGATIVE
Protein, ur: NEGATIVE mg/dL
Specific Gravity, Urine: 1.008 (ref 1.005–1.030)
Urobilinogen, UA: 0.2 mg/dL (ref 0.0–1.0)
pH: 6 (ref 5.0–8.0)

## 2011-07-17 LAB — DIFFERENTIAL
Lymphocytes Relative: 30 % (ref 12–46)
Lymphs Abs: 1.2 10*3/uL (ref 0.7–4.0)
Monocytes Absolute: 0.2 10*3/uL (ref 0.1–1.0)
Monocytes Relative: 5 % (ref 3–12)
Neutro Abs: 2.4 10*3/uL (ref 1.7–7.7)

## 2011-07-17 LAB — CBC
HCT: 39.2 % (ref 36.0–46.0)
MCV: 92.6 fL (ref 78.0–100.0)
Platelets: 172 10*3/uL (ref 150–400)
RDW: 13.3 % (ref 11.5–15.5)

## 2011-07-17 LAB — APTT: aPTT: 36 s (ref 24–37)

## 2011-07-17 LAB — URINE CULTURE
Colony Count: NO GROWTH
Culture: NO GROWTH

## 2011-07-17 LAB — PROTIME-INR
INR: 1 (ref 0.00–1.49)
Prothrombin Time: 13.3 s (ref 11.6–15.2)

## 2011-09-17 ENCOUNTER — Other Ambulatory Visit: Payer: Self-pay | Admitting: Family Medicine

## 2011-09-17 DIAGNOSIS — Z1231 Encounter for screening mammogram for malignant neoplasm of breast: Secondary | ICD-10-CM

## 2011-10-23 ENCOUNTER — Ambulatory Visit
Admission: RE | Admit: 2011-10-23 | Discharge: 2011-10-23 | Disposition: A | Discharge status: Home / Self Care | Payer: Medicare Other | Source: Ambulatory Visit | Attending: Family Medicine | Admitting: Family Medicine

## 2011-10-23 DIAGNOSIS — Z1231 Encounter for screening mammogram for malignant neoplasm of breast: Secondary | ICD-10-CM

## 2012-09-20 ENCOUNTER — Other Ambulatory Visit: Payer: Self-pay | Admitting: Family Medicine

## 2012-09-20 DIAGNOSIS — Z1231 Encounter for screening mammogram for malignant neoplasm of breast: Secondary | ICD-10-CM

## 2012-10-27 ENCOUNTER — Ambulatory Visit
Admission: RE | Admit: 2012-10-27 | Discharge: 2012-10-27 | Disposition: A | Discharge status: Home / Self Care | Payer: Medicare Other | Source: Ambulatory Visit | Attending: Family Medicine | Admitting: Family Medicine

## 2012-10-27 DIAGNOSIS — Z1231 Encounter for screening mammogram for malignant neoplasm of breast: Secondary | ICD-10-CM

## 2013-09-27 ENCOUNTER — Other Ambulatory Visit: Payer: Self-pay

## 2013-09-27 DIAGNOSIS — Z1231 Encounter for screening mammogram for malignant neoplasm of breast: Secondary | ICD-10-CM

## 2013-11-02 ENCOUNTER — Ambulatory Visit
Admission: RE | Admit: 2013-11-02 | Discharge: 2013-11-02 | Disposition: A | Discharge status: Home / Self Care | Payer: Medicare Other | Source: Ambulatory Visit

## 2013-11-02 DIAGNOSIS — Z1231 Encounter for screening mammogram for malignant neoplasm of breast: Secondary | ICD-10-CM

## 2013-11-04 ENCOUNTER — Other Ambulatory Visit: Payer: Self-pay | Admitting: Family Medicine

## 2013-11-04 DIAGNOSIS — R928 Other abnormal and inconclusive findings on diagnostic imaging of breast: Secondary | ICD-10-CM

## 2013-11-14 ENCOUNTER — Other Ambulatory Visit: Payer: Medicare Other

## 2013-12-09 ENCOUNTER — Other Ambulatory Visit: Payer: Medicare Other

## 2013-12-20 ENCOUNTER — Ambulatory Visit
Admission: RE | Admit: 2013-12-20 | Discharge: 2013-12-20 | Disposition: A | Discharge status: Home / Self Care | Payer: Medicare Other | Source: Ambulatory Visit | Attending: Family Medicine | Admitting: Family Medicine

## 2013-12-20 DIAGNOSIS — R928 Other abnormal and inconclusive findings on diagnostic imaging of breast: Secondary | ICD-10-CM

## 2014-01-26 ENCOUNTER — Other Ambulatory Visit: Payer: Self-pay | Admitting: Gastroenterology

## 2014-03-21 ENCOUNTER — Encounter (HOSPITAL_COMMUNITY): Payer: Self-pay | Admitting: Pharmacy Technician

## 2014-03-30 ENCOUNTER — Encounter (HOSPITAL_COMMUNITY): Payer: Self-pay | Admitting: *Deleted

## 2014-03-30 NOTE — Progress Notes (Signed)
Spoke with dr germeroth and made aware patient has 4 brain aneurysms and has not seen dr Marcheta Grammesdevashar or had scan of aneurysms since 02-20-2006, per dr germeroth, patient must see dr Marcheta Grammesdevashar and have aneurysm scan prior to colonscopy being done, message left with Lady Garykatrina at dr Henriette Combsjohnson's office.

## 2014-03-31 ENCOUNTER — Other Ambulatory Visit (HOSPITAL_COMMUNITY): Payer: Self-pay | Admitting: Gastroenterology

## 2014-03-31 ENCOUNTER — Other Ambulatory Visit (HOSPITAL_COMMUNITY): Payer: Self-pay | Admitting: Interventional Radiology

## 2014-03-31 DIAGNOSIS — I729 Aneurysm of unspecified site: Secondary | ICD-10-CM

## 2014-04-03 ENCOUNTER — Ambulatory Visit (HOSPITAL_COMMUNITY): Admission: RE | Admit: 2014-04-03 | Payer: Medicare Other | Source: Ambulatory Visit

## 2014-04-05 ENCOUNTER — Ambulatory Visit (HOSPITAL_COMMUNITY)
Admission: RE | Admit: 2014-04-05 | Discharge: 2014-04-05 | Disposition: A | Discharge status: Home / Self Care | Payer: Medicare Other | Source: Ambulatory Visit | Attending: Gastroenterology | Admitting: Gastroenterology

## 2014-04-05 DIAGNOSIS — I729 Aneurysm of unspecified site: Secondary | ICD-10-CM

## 2014-04-06 NOTE — Progress Notes (Signed)
Medical clearance note dr deveshwar received by fax and placed on pt chart for 04-11-14 colonscopy

## 2014-04-11 ENCOUNTER — Ambulatory Visit (HOSPITAL_COMMUNITY)
Admission: RE | Admit: 2014-04-11 | Discharge: 2014-04-11 | Disposition: A | Discharge status: Home / Self Care | Payer: Medicare Other | Source: Ambulatory Visit | Attending: Gastroenterology | Admitting: Gastroenterology

## 2014-04-11 ENCOUNTER — Ambulatory Visit (HOSPITAL_COMMUNITY): Payer: Medicare Other | Admitting: Anesthesiology

## 2014-04-11 ENCOUNTER — Encounter (HOSPITAL_COMMUNITY): Payer: Medicare Other | Admitting: Anesthesiology

## 2014-04-11 ENCOUNTER — Encounter (HOSPITAL_COMMUNITY)
Admission: RE | Disposition: A | Discharge status: Home / Self Care | Payer: Self-pay | Source: Ambulatory Visit | Attending: Gastroenterology

## 2014-04-11 ENCOUNTER — Encounter (HOSPITAL_COMMUNITY): Payer: Self-pay | Admitting: *Deleted

## 2014-04-11 DIAGNOSIS — Z79899 Other long term (current) drug therapy: Secondary | ICD-10-CM | POA: Insufficient documentation

## 2014-04-11 DIAGNOSIS — K5730 Diverticulosis of large intestine without perforation or abscess without bleeding: Secondary | ICD-10-CM | POA: Insufficient documentation

## 2014-04-11 DIAGNOSIS — E039 Hypothyroidism, unspecified: Secondary | ICD-10-CM | POA: Insufficient documentation

## 2014-04-11 DIAGNOSIS — Z96659 Presence of unspecified artificial knee joint: Secondary | ICD-10-CM | POA: Insufficient documentation

## 2014-04-11 DIAGNOSIS — Z1211 Encounter for screening for malignant neoplasm of colon: Secondary | ICD-10-CM | POA: Insufficient documentation

## 2014-04-11 DIAGNOSIS — I10 Essential (primary) hypertension: Secondary | ICD-10-CM | POA: Insufficient documentation

## 2014-04-11 DIAGNOSIS — M1990 Unspecified osteoarthritis, unspecified site: Secondary | ICD-10-CM | POA: Insufficient documentation

## 2014-04-11 HISTORY — DX: Cerebral aneurysm, nonruptured: I67.1

## 2014-04-11 HISTORY — DX: Hypothyroidism, unspecified: E03.9

## 2014-04-11 HISTORY — DX: Unspecified osteoarthritis, unspecified site: M19.90

## 2014-04-11 HISTORY — DX: Essential (primary) hypertension: I10

## 2014-04-11 HISTORY — PX: COLONOSCOPY WITH PROPOFOL: SHX5780

## 2014-04-11 SURGERY — COLONOSCOPY WITH PROPOFOL
Anesthesia: Monitor Anesthesia Care

## 2014-04-11 MED ORDER — SODIUM CHLORIDE 0.9 % IV SOLN: INTRAVENOUS | Status: DC

## 2014-04-11 MED ORDER — LACTATED RINGERS IV SOLN
INTRAVENOUS | Status: DC
  Administered 2014-04-11: 1000 mL via INTRAVENOUS

## 2014-04-11 MED ORDER — FENTANYL CITRATE 0.05 MG/ML IJ SOLN
INTRAMUSCULAR | Status: AC
  Filled 2014-04-11: qty 2

## 2014-04-11 MED ORDER — LACTATED RINGERS IV SOLN
INTRAVENOUS | Status: DC | PRN
  Administered 2014-04-11: 09:00:00 via INTRAVENOUS

## 2014-04-11 MED ORDER — PROPOFOL 10 MG/ML IV BOLUS
INTRAVENOUS | Status: AC
  Filled 2014-04-11: qty 20

## 2014-04-11 MED ORDER — MIDAZOLAM HCL 5 MG/5ML IJ SOLN
INTRAMUSCULAR | Status: DC | PRN
  Administered 2014-04-11: 1 mg via INTRAVENOUS

## 2014-04-11 MED ORDER — GLYCOPYRROLATE 0.2 MG/ML IJ SOLN
INTRAMUSCULAR | Status: AC
  Filled 2014-04-11: qty 1

## 2014-04-11 MED ORDER — PROPOFOL INFUSION 10 MG/ML OPTIME
INTRAVENOUS | Status: DC | PRN
  Administered 2014-04-11: 100 ug/kg/min via INTRAVENOUS

## 2014-04-11 MED ORDER — KETAMINE HCL 10 MG/ML IJ SOLN
INTRAMUSCULAR | Status: DC | PRN
  Administered 2014-04-11 (×2): 10 mg via INTRAVENOUS

## 2014-04-11 MED ORDER — KETAMINE HCL 10 MG/ML IJ SOLN
INTRAMUSCULAR | Status: AC
  Filled 2014-04-11: qty 1

## 2014-04-11 MED ORDER — FENTANYL CITRATE 0.05 MG/ML IJ SOLN
INTRAMUSCULAR | Status: DC | PRN
  Administered 2014-04-11: 50 ug via INTRAVENOUS

## 2014-04-11 SURGICAL SUPPLY — 22 items

## 2014-04-11 NOTE — Transfer of Care (Signed)
Immediate Anesthesia Transfer of Care Note  Patient: Meredith [name]  Procedure(s) Performed: Procedure(s): COLONOSCOPY WITH PROPOFOL (N/A)  Patient Location: PACU  Anesthesia Type:MAC  Level of Consciousness: awake, sedated and patient cooperative  Airway & Oxygen Therapy: Patient Spontanous Breathing and Patient connected to nasal cannula oxygen  Post-op Assessment: Report given to PACU RN and Post -op Vital signs reviewed and stable  Post vital signs: stable  Complications: No apparent anesthesia complications

## 2014-04-11 NOTE — Discharge Instructions (Signed)
Colonoscopy, Care After °These instructions give you information on caring for yourself after your procedure. Your doctor may also give you more specific instructions. Call your doctor if you have any problems or questions after your procedure. °HOME CARE °· Do not drive for 24 hours. °· Do not sign important papers or use machinery for 24 hours. °· You may shower. °· You may go back to your usual activities, but go slower for the first 24 hours. °· Take rest breaks often during the first 24 hours. °· Walk around or use warm packs on your belly (abdomen) if you have belly cramping or gas. °· Drink enough fluids to keep your pee (urine) clear or pale yellow. °· Resume your normal diet. Avoid heavy or fried foods. °· Avoid drinking alcohol for 24 hours or as told by your doctor. °· Only take medicines as told by your doctor. °If a tissue sample (biopsy) was taken during the procedure:  °· Do not take aspirin or blood thinners for 7 days, or as told by your doctor. °· Do not drink alcohol for 7 days, or as told by your doctor. °· Eat soft foods for the first 24 hours. °GET HELP IF: °You still have a small amount of blood in your poop (stool) 2-3 days after the procedure. °GET HELP RIGHT AWAY IF: °· You have more than a small amount of blood in your poop. °· You see clumps of tissue (blood clots) in your poop. °· Your belly is puffy (swollen). °· You feel sick to your stomach (nauseous) or throw up (vomit). °· You have a fever. °· You have belly pain that gets worse and medicine does not help. °MAKE SURE YOU: °· Understand these instructions. °· Will watch your condition. °· Will get help right away if you are not doing well or get worse. °Document Released: 11/01/2010 Document Revised: 10/04/2013 Document Reviewed: 06/06/2013 °ExitCare® Patient Information ©2015 [employer]. This information is not intended to replace advice given to you by your health care provider. Make sure you discuss any questions you have with  your health care provider. ° °Monitored Anesthesia Care  °Monitored anesthesia care is an anesthesia service for a medical procedure. Anesthesia is the loss of the ability to feel pain. It is produced by medications called anesthetics. It may affect a small area of your body (local anesthesia), a large area of your body (regional anesthesia), or your entire body (general anesthesia). The need for monitored anesthesia care depends your procedure, your condition, and the potential need for regional or general anesthesia. It is often provided during procedures where:  °· General anesthesia may be needed if there are complications. This is because you need special care when you are under general anesthesia.   °· You will be under local or regional anesthesia. This is so that you are able to have higher levels of anesthesia if needed.   °· You will receive calming medications (sedatives). This is especially the case if sedatives are given to put you in a semi-conscious state of relaxation (deep sedation). This is because the amount of sedative needed to produce this state can be hard to predict. Too much of a sedative can produce general anesthesia. °Monitored anesthesia care is performed by one or more caregivers who have special training in all types of anesthesia. You will need to meet with these caregivers before your procedure. During this meeting, they will ask you about your medical history. They will also give you instructions to follow. (For example, you   will need to stop eating and drinking before your procedure. You may also need to stop or change medications you are taking.) During your procedure, your caregivers will stay with you. They will:  °· Watch your condition. This includes watching you blood pressure, breathing, and level of pain.   °· Diagnose and treat problems that occur.   °· Give medications if they are needed. These may include calming medications (sedatives) and anesthetics.   °· Make sure  you are comfortable.   °Having monitored anesthesia care does not necessarily mean that you will be under anesthesia. It does mean that your caregivers will be able to manage anesthesia if you need it or if it occurs. It also means that you will be able to have a different type of anesthesia than you are having if you need it. When your procedure is complete, your caregivers will continue to watch your condition. They will make sure any medications wear off before you are allowed to go home.  °Document Released: 06/25/2005 Document Revised: 01/24/2013 Document Reviewed: 11/10/2012 °ExitCare® Patient Information ©2015 [employer]. This information is not intended to replace advice given to you by your health care provider. Make sure you discuss any questions you have with your health care provider. ° ° °

## 2014-04-11 NOTE — H&P (Signed)
  Procedure: Screening colonoscopy. Normal screening colonoscopy performed 10/05/2003  History: The patient is a 68 year old female born September 04, 1946. She underwent a normal screening colonoscopy on 10/05/2003. She is scheduled to undergo a repeat screening colonoscopy today.  Medication allergies: None  Past medical history: Bilateral knee replacement surgeries. Left paraophthamic aneurysm swelling. Bilateral tubal ligation. Osteoarthritis. Hypercholesterolemia. Hypertension. Hypothyroidism.  Exam: The patient is alert and lying comfortably on the endoscopy stretcher. Abdomen is soft and nontender to palpation. Lungs are clear to auscultation. Cardiac exam reveals a regular rhythm  Plan: Proceed with screening colonoscopy

## 2014-04-11 NOTE — Anesthesia Preprocedure Evaluation (Signed)
Anesthesia Evaluation  Patient identified by MRN, date of birth, ID band Patient awake    Reviewed: Allergy & Precautions, H&P , NPO status , Patient's Chart, lab work & pertinent test results  Airway Mallampati: II TM Distance: >3 FB Neck ROM: Full    Dental  (+) Dental Advisory Given   Pulmonary neg pulmonary ROS,  breath sounds clear to auscultation        Cardiovascular hypertension, Pt. on medications + Peripheral Vascular Disease Rhythm:Regular Rate:Normal     Neuro/Psych negative neurological ROS  negative psych ROS   GI/Hepatic negative GI ROS, Neg liver ROS,   Endo/Other  Hypothyroidism   Renal/GU negative Renal ROS     Musculoskeletal negative musculoskeletal ROS (+)   Abdominal   Peds  Hematology negative hematology ROS (+)   Anesthesia Other Findings   Reproductive/Obstetrics negative OB ROS                           Anesthesia Physical Anesthesia Plan  ASA: II  Anesthesia Plan: MAC   Post-op Pain Management:    Induction: Intravenous  Airway Management Planned:   Additional Equipment:   Intra-op Plan:   Post-operative Plan:   Informed Consent: I have reviewed the patients History and Physical, chart, labs and discussed the procedure including the risks, benefits and alternatives for the proposed anesthesia with the patient or authorized representative who has indicated his/her understanding and acceptance.   Dental advisory given  Plan Discussed with: CRNA  Anesthesia Plan Comments:         Anesthesia Quick Evaluation

## 2014-04-11 NOTE — Op Note (Signed)
Procedure: Screening colonoscopy. Normal screening colonoscopy performed on 10/05/2003  Endoscopist: Danise EdgeMartin Johnson  Premedication: Propofol administered by anesthesia  Procedure: The patient was placed in the left lateral decubitus position. Anal inspection and digital rectal exam were normal. The Pentax pediatric colonoscope was introduced into the rectum and advanced to the cecum. A normal-appearing ileocecal valve was intubated and the terminal ileum inspected. Colonic preparation for the exam today was good.  Rectum. Normal. Retroflexed view of the distal rectum normal  Sigmoid colon and descending colon. Colonic diverticulosis  Splenic flexure. Normal  Transverse colon. Normal  Hepatic flexure. Normal  Ascending colon. Normal  Cecum and ileocecal valve. Normal  Terminal ileum. Normal  Assessment: Normal screening colonoscopy  Recommendation: Schedule repeat screening colonoscopy in 10 years if the patient's health allows.

## 2014-04-11 NOTE — Anesthesia Postprocedure Evaluation (Signed)
Anesthesia Post Note  Patient: Meredith [name]  Procedure(s) Performed: Procedure(s) (LRB): COLONOSCOPY WITH PROPOFOL (N/A)  Anesthesia type: MAC  Patient location: PACU  Post pain: Pain level controlled  Post assessment: Post-op Vital signs reviewed  Last Vitals: BP 150/65  Pulse 51  Temp(Src) 36.7 C (Oral)  Resp 18  Ht 5' 2.5" (1.588 m)  Wt 220 lb (99.791 kg)  BMI 39.57 kg/m2  SpO2 98%  Post vital signs: Reviewed  Level of consciousness: awake  Complications: No apparent anesthesia complications

## 2014-04-12 ENCOUNTER — Encounter (HOSPITAL_COMMUNITY): Payer: Self-pay | Admitting: Gastroenterology

## 2014-05-01 ENCOUNTER — Other Ambulatory Visit (HOSPITAL_COMMUNITY): Payer: Self-pay | Admitting: Interventional Radiology

## 2014-05-01 DIAGNOSIS — I729 Aneurysm of unspecified site: Secondary | ICD-10-CM

## 2014-05-18 ENCOUNTER — Ambulatory Visit (HOSPITAL_COMMUNITY)
Admission: RE | Admit: 2014-05-18 | Discharge: 2014-05-18 | Disposition: A | Discharge status: Home / Self Care | Payer: Medicare Other | Source: Ambulatory Visit | Attending: Interventional Radiology | Admitting: Interventional Radiology

## 2014-05-18 DIAGNOSIS — I729 Aneurysm of unspecified site: Secondary | ICD-10-CM

## 2014-05-18 LAB — CREATININE, SERUM
Creatinine, Ser: 0.98 mg/dL (ref 0.50–1.10)
GFR calc non Af Amer: 57 mL/min — ABNORMAL LOW (ref >90)
GFR, EST AFRICAN AMERICAN: 66 mL/min — AB (ref >90)

## 2014-05-18 MED ORDER — GADOBENATE DIMEGLUMINE 529 MG/ML IV SOLN
20.0000 mL | Freq: Once | INTRAVENOUS | Status: AC | PRN
  Administered 2014-05-18: 20 mL via INTRAVENOUS

## 2014-06-01 ENCOUNTER — Telehealth (HOSPITAL_COMMUNITY): Payer: Self-pay | Admitting: Interventional Radiology

## 2014-06-01 ENCOUNTER — Other Ambulatory Visit (HOSPITAL_COMMUNITY): Payer: Self-pay | Admitting: Interventional Radiology

## 2014-06-01 DIAGNOSIS — I729 Aneurysm of unspecified site: Secondary | ICD-10-CM

## 2014-06-01 NOTE — Telephone Encounter (Signed)
Called pt, left message w/ her son for her to call me back to schedule consult with Deveshwar. JM

## 2014-06-05 ENCOUNTER — Telehealth (HOSPITAL_COMMUNITY): Payer: Self-pay | Admitting: Interventional Radiology

## 2014-06-05 NOTE — Telephone Encounter (Signed)
Called pt, scheduled consult JM 

## 2014-06-09 ENCOUNTER — Ambulatory Visit (HOSPITAL_COMMUNITY)
Admission: RE | Admit: 2014-06-09 | Discharge: 2014-06-09 | Disposition: A | Discharge status: Home / Self Care | Payer: Medicare Other | Source: Ambulatory Visit | Attending: Interventional Radiology | Admitting: Interventional Radiology

## 2014-06-09 DIAGNOSIS — I729 Aneurysm of unspecified site: Secondary | ICD-10-CM

## 2014-08-14 ENCOUNTER — Telehealth (HOSPITAL_COMMUNITY): Payer: Self-pay | Admitting: Interventional Radiology

## 2014-08-14 ENCOUNTER — Other Ambulatory Visit (HOSPITAL_COMMUNITY): Payer: Self-pay | Admitting: Interventional Radiology

## 2014-08-14 DIAGNOSIS — I729 Aneurysm of unspecified site: Secondary | ICD-10-CM

## 2014-08-14 NOTE — Telephone Encounter (Signed)
Called pt to schedule tx of aneurysm. Pt says she has decided after discussing w/ family that she would like to just continue on with conservative management of the aneurysm ie... MRI/MRA. I told her that I would relay this message to Deveshwar and call her back if needed. JM

## 2014-08-16 ENCOUNTER — Other Ambulatory Visit: Payer: Self-pay | Admitting: Radiology

## 2014-08-17 ENCOUNTER — Encounter (HOSPITAL_COMMUNITY): Payer: Self-pay

## 2014-08-17 DIAGNOSIS — I10 Essential (primary) hypertension: Secondary | ICD-10-CM | POA: Diagnosis not present

## 2014-08-17 DIAGNOSIS — Z9889 Other specified postprocedural states: Secondary | ICD-10-CM | POA: Diagnosis not present

## 2014-08-17 DIAGNOSIS — Z48812 Encounter for surgical aftercare following surgery on the circulatory system: Secondary | ICD-10-CM | POA: Diagnosis not present

## 2014-08-17 DIAGNOSIS — Z5309 Procedure and treatment not carried out because of other contraindication: Secondary | ICD-10-CM | POA: Diagnosis not present

## 2014-08-17 DIAGNOSIS — Z7902 Long term (current) use of antithrombotics/antiplatelets: Secondary | ICD-10-CM | POA: Diagnosis not present

## 2014-08-17 DIAGNOSIS — E039 Hypothyroidism, unspecified: Secondary | ICD-10-CM | POA: Diagnosis not present

## 2014-08-17 DIAGNOSIS — Z7982 Long term (current) use of aspirin: Secondary | ICD-10-CM | POA: Diagnosis not present

## 2014-08-17 DIAGNOSIS — I671 Cerebral aneurysm, nonruptured: Secondary | ICD-10-CM | POA: Diagnosis not present

## 2014-08-17 LAB — COMPREHENSIVE METABOLIC PANEL
ALT: 13 U/L (ref 0–35)
AST: 19 U/L (ref 0–37)
Albumin: 3.7 g/dL (ref 3.5–5.2)
Alkaline Phosphatase: 118 U/L — ABNORMAL HIGH (ref 39–117)
Anion gap: 13 (ref 5–15)
BUN: 17 mg/dL (ref 6–23)
CALCIUM: 9.6 mg/dL (ref 8.4–10.5)
CO2: 25 meq/L (ref 19–32)
CREATININE: 0.72 mg/dL (ref 0.50–1.10)
Chloride: 102 mEq/L (ref 96–112)
GFR calc Af Amer: >90 mL/min (ref >90)
GFR calc non Af Amer: 85 mL/min — ABNORMAL LOW (ref >90)
Glucose, Bld: 94 mg/dL (ref 70–99)
Potassium: 3.7 mEq/L (ref 3.7–5.3)
SODIUM: 140 meq/L (ref 137–147)
TOTAL PROTEIN: 7.9 g/dL (ref 6.0–8.3)
Total Bilirubin: 0.6 mg/dL (ref 0.3–1.2)

## 2014-08-17 LAB — CBC WITH DIFFERENTIAL/PLATELET
BASOS ABS: 0 10*3/uL (ref 0.0–0.1)
BASOS PCT: 0 % (ref 0–1)
EOS ABS: 0 10*3/uL (ref 0.0–0.7)
EOS PCT: 1 % (ref 0–5)
HEMATOCRIT: 37.9 % (ref 36.0–46.0)
Hemoglobin: 12.5 g/dL (ref 12.0–15.0)
LYMPHS PCT: 36 % (ref 12–46)
Lymphs Abs: 1.4 10*3/uL (ref 0.7–4.0)
MCH: 29.8 pg (ref 26.0–34.0)
MCHC: 33 g/dL (ref 30.0–36.0)
MCV: 90.2 fL (ref 78.0–100.0)
MONO ABS: 0.4 10*3/uL (ref 0.1–1.0)
Monocytes Relative: 9 % (ref 3–12)
Neutro Abs: 2.1 10*3/uL (ref 1.7–7.7)
Neutrophils Relative %: 54 % (ref 43–77)
PLATELETS: 184 10*3/uL (ref 150–400)
RBC: 4.2 MIL/uL (ref 3.87–5.11)
RDW: 13.1 % (ref 11.5–15.5)
WBC: 4 10*3/uL (ref 4.0–10.5)

## 2014-08-17 LAB — APTT: aPTT: 34 seconds (ref 24–37)

## 2014-08-17 LAB — PROTIME-INR
INR: 1.05 (ref 0.00–1.49)
PROTHROMBIN TIME: 13.9 s (ref 11.6–15.2)

## 2014-08-17 NOTE — Pre-Procedure Instructions (Signed)
Annye RuskFrances L Deroy  08/17/2014   Your procedure is scheduled on:  Nov 9th at 0830  [facility] [phone].  Call this number if you have problems the morning of surgery: [phone]   Remember:   Do not eat food or drink liquids after midnight.   Take these medicines the morning of surgery with A SIP OF WATER: take aspirin and Plavix as directed, Synthroid (levothroid)  Stop taking BC's Goody's, Herbal medications, Ibuprofen    Do not wear jewelry, make-up or nail polish.  Do not wear lotions, powders, or perfumes. You may wear deodorant.  Do not shave 48 hours prior to surgery. Men may shave face and neck.  Do not bring valuables to the hospital.  [facility] any belongings or valuables.               Contacts, dentures or bridgework may not be worn into surgery.  Leave suitcase in the car. After surgery it may be brought to your room.  For patients admitted to the hospital, discharge time is determined by your  treatment team.               Patients discharged the day of surgery will not be allowed to drive  home.    Special Instructions: [place] - Preparing for Surgery  Before surgery, you can play an important role.  Because skin is not sterile, your skin needs to be as free of germs as possible.  You can reduce the number of germs on you skin by washing with CHG (chlorahexidine gluconate) soap before surgery.  CHG is an antiseptic cleaner which kills germs and bonds with the skin to continue killing germs even after washing.  Please DO NOT use if you have an allergy to CHG or antibacterial soaps.  If your skin becomes reddened/irritated stop using the CHG and inform your nurse when you arrive at Short Stay.  Do not shave (including legs and underarms) for at least 48 hours prior to the first CHG shower.  You may shave your face.  Please follow these instructions carefully:   1.  Shower with CHG Soap the night before  surgery and the                                morning of Surgery.  2.  If you choose to wash your hair, wash your hair first as usual with your       normal shampoo.  3.  After you shampoo, rinse your hair and body thoroughly to remove the                      Shampoo.  4.  Use CHG as you would any other liquid soap.  You can apply chg directly       to the skin and wash gently with scrungie or a clean washcloth.  5.  Apply the CHG Soap to your body ONLY FROM THE NECK DOWN.        Do not use on open wounds or open sores.  Avoid contact with your eyes,       ears, mouth and genitals (private parts).  Wash genitals (private parts)       with your normal soap.  6.  Wash thoroughly, paying special attention to the area where your surgery  will be performed.  7.  Thoroughly rinse your body with warm water from the neck down.  8.  DO NOT shower/wash with your normal soap after using and rinsing off       the CHG Soap.  9.  Pat yourself dry with a clean towel.            10.  Wear clean pajamas.            11.  Place clean sheets on your bed the night of your first shower and do not        sleep with pets.  Day of Surgery  Do not apply any lotions/deoderants the morning of surgery.  Please wear clean clothes to the hospital/surgery center.      Please read over the following fact sheets that you were given: Pain Booklet, Coughing and Deep Breathing and Surgical Site Infection Prevention

## 2014-08-17 NOTE — Progress Notes (Signed)
PCP is Dr. Clovis RileyMitchell Denies ever seeing a cardiologist. Denies ever having a stress test, echo, or card cath. Denies having a recent EKG.

## 2014-08-18 ENCOUNTER — Other Ambulatory Visit: Payer: Self-pay | Admitting: Radiology

## 2014-08-21 ENCOUNTER — Other Ambulatory Visit: Payer: Self-pay | Admitting: Radiology

## 2014-08-21 ENCOUNTER — Encounter (HOSPITAL_COMMUNITY)
Admission: RE | Disposition: A | Discharge status: Home / Self Care | Payer: Self-pay | Source: Ambulatory Visit | Attending: Interventional Radiology

## 2014-08-21 ENCOUNTER — Ambulatory Visit (HOSPITAL_COMMUNITY): Payer: Medicare Other | Admitting: Anesthesiology

## 2014-08-21 ENCOUNTER — Encounter (HOSPITAL_COMMUNITY): Payer: Self-pay

## 2014-08-21 ENCOUNTER — Encounter (HOSPITAL_COMMUNITY): Payer: Self-pay | Admitting: Anesthesiology

## 2014-08-21 ENCOUNTER — Ambulatory Visit (HOSPITAL_COMMUNITY)
Admission: RE | Admit: 2014-08-21 | Discharge: 2014-08-21 | Disposition: A | Discharge status: Home / Self Care | Payer: Medicare Other | Source: Ambulatory Visit | Attending: Interventional Radiology | Admitting: Interventional Radiology

## 2014-08-21 ENCOUNTER — Encounter (HOSPITAL_COMMUNITY)
Admission: RE | Admit: 2014-08-21 | Discharge: 2014-08-21 | Disposition: A | Discharge status: Home / Self Care | Payer: Medicare Other | Source: Ambulatory Visit | Attending: Interventional Radiology | Admitting: Interventional Radiology

## 2014-08-21 DIAGNOSIS — Z7982 Long term (current) use of aspirin: Secondary | ICD-10-CM | POA: Insufficient documentation

## 2014-08-21 DIAGNOSIS — I671 Cerebral aneurysm, nonruptured: Secondary | ICD-10-CM | POA: Insufficient documentation

## 2014-08-21 DIAGNOSIS — Z7902 Long term (current) use of antithrombotics/antiplatelets: Secondary | ICD-10-CM | POA: Insufficient documentation

## 2014-08-21 DIAGNOSIS — Z79899 Other long term (current) drug therapy: Secondary | ICD-10-CM

## 2014-08-21 DIAGNOSIS — Z48812 Encounter for surgical aftercare following surgery on the circulatory system: Secondary | ICD-10-CM

## 2014-08-21 DIAGNOSIS — I10 Essential (primary) hypertension: Secondary | ICD-10-CM | POA: Insufficient documentation

## 2014-08-21 DIAGNOSIS — Z5309 Procedure and treatment not carried out because of other contraindication: Secondary | ICD-10-CM | POA: Diagnosis not present

## 2014-08-21 DIAGNOSIS — E039 Hypothyroidism, unspecified: Secondary | ICD-10-CM | POA: Insufficient documentation

## 2014-08-21 HISTORY — PX: RADIOLOGY WITH ANESTHESIA: SHX6223

## 2014-08-21 HISTORY — DX: Aneurysm of unspecified site: I72.9

## 2014-08-21 LAB — PLATELET INHIBITION P2Y12: PLATELET FUNCTION P2Y12: 244 [PRU] (ref 194–418)

## 2014-08-21 SURGERY — RADIOLOGY WITH ANESTHESIA
Anesthesia: General

## 2014-08-21 MED ORDER — LACTATED RINGERS IV SOLN
INTRAVENOUS | Status: DC
  Administered 2014-08-21: 07:00:00 via INTRAVENOUS

## 2014-08-21 MED ORDER — ASPIRIN 81 MG PO CHEW
243.0000 mg | CHEWABLE_TABLET | Freq: Once | ORAL | Status: AC
  Administered 2014-08-21: 243 mg via ORAL

## 2014-08-21 MED ORDER — SODIUM CHLORIDE 0.9 % IV SOLN: Freq: Once | INTRAVENOUS | Status: DC

## 2014-08-21 MED ORDER — CEFAZOLIN SODIUM-DEXTROSE 2-3 GM-% IV SOLR
2.0000 g | Freq: Once | INTRAVENOUS | Status: DC
  Filled 2014-08-21 (×2): qty 50

## 2014-08-21 MED ORDER — NIMODIPINE 30 MG PO CAPS
60.0000 mg | ORAL_CAPSULE | Freq: Once | ORAL | Status: AC
  Administered 2014-08-21: 60 mg via ORAL
  Filled 2014-08-21 (×2): qty 2

## 2014-08-21 MED ORDER — ASPIRIN 81 MG PO CHEW
CHEWABLE_TABLET | ORAL | Status: AC
  Administered 2014-08-21: 243 mg via ORAL
  Filled 2014-08-21: qty 3

## 2014-08-21 MED ORDER — CLOPIDOGREL BISULFATE 75 MG PO TABS
75.0000 mg | ORAL_TABLET | Freq: Once | ORAL | Status: DC
  Filled 2014-08-21: qty 1

## 2014-08-21 MED ORDER — ASPIRIN EC 325 MG PO TBEC
325.0000 mg | DELAYED_RELEASE_TABLET | Freq: Once | ORAL | Status: DC
  Filled 2014-08-21: qty 1

## 2014-08-21 MED ORDER — LACTATED RINGERS IV SOLN
INTRAVENOUS | Status: AC | PRN
  Administered 2014-08-21: 08:00:00 via INTRAVENOUS

## 2014-08-21 NOTE — Progress Notes (Signed)
Patient ID: Meredith [name], female   DOB: [birth year], 68 y.o.   MRN: [id number]   Pt was tentatively scheduled for L ICA flow diverter placement for aneurysm embolization P2y12 244 today  Procedure will not take place today per Dr Corliss Skainseveshwar Pt instructed to change meds: ASA 325 mg daily Plavix 75 mg BID  Will recheck P2y12 Thur this week (11/12) Plan from there

## 2014-08-21 NOTE — Anesthesia Preprocedure Evaluation (Signed)
Anesthesia Evaluation  Patient identified by MRN, date of birth, ID band Patient awake    Reviewed: Allergy & Precautions, H&P , NPO status , Patient's Chart, lab work & pertinent test results  Airway Mallampati: II  TM Distance: >3 FB Neck ROM: Full    Dental  (+) Edentulous Upper, Partial Lower, Dental Advisory Given   Pulmonary          Cardiovascular hypertension, Pt. on medications     Neuro/Psych    GI/Hepatic   Endo/Other  Hypothyroidism   Renal/GU      Musculoskeletal   Abdominal   Peds  Hematology   Anesthesia Other Findings 6 teeth across lower front  Reproductive/Obstetrics                             Anesthesia Physical Anesthesia Plan Anesthesia Quick Evaluation

## 2014-08-21 NOTE — H&P (Signed)
Chief Complaint: L Internal carotid artery aneurysms  Referring Physician(s): Brinae Woods K  History of Present Illness: Meredith [name] is a 68 y.o. 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 Now scheduled for cerebral arteriogram with possible flow diverter device embolization at L ICA   Past Medical History  Diagnosis Date  . Hypertension   . Hypothyroidism   . Arthritis   . Brain aneurysm     have 4 now, last saw dr Marcheta Grammesdevashar 2006, last scan 02-20-2006 epic  . Aneurysm     Past Surgical History  Procedure Laterality Date  . Aneurysm coiling   yrs ago    x 1 done  . Joint replacement Bilateral one in 2007 and one in 2010    knee  . Colonoscopy with propofol N/A 04/11/2014    Procedure: COLONOSCOPY WITH PROPOFOL;  Surgeon: Charolett BumpersMartin K Johnson, MD;  Location: [facility];  Service: Endoscopy;  Laterality: N/A;  . Eye surgery Bilateral     Cataracts removed    Allergies: Review of patient's allergies indicates no known allergies.  Medications: Prior to Admission medications   Medication Sig Start Date End Date Taking? Authorizing Provider  aspirin EC 81 MG tablet Take 81 mg by mouth daily.   Yes Historical Provider, MD  atorvastatin (LIPITOR) 40 MG tablet Take 40 mg by mouth every evening.   Yes Historical Provider, MD  clopidogrel (PLAVIX) 75 MG tablet Take 75 mg by mouth daily. 06/14/14  Yes Historical Provider, MD  hydrochlorothiazide (HYDRODIURIL) 25 MG tablet Take 25 mg by mouth every morning.   Yes Historical Provider, MD  levothyroxine (SYNTHROID, LEVOTHROID) 175 MCG tablet Take 175 mcg by mouth daily before breakfast.   Yes Historical Provider, MD    History  reviewed. No pertinent family history.  History   Social History  . Marital Status: Widowed    Spouse Name: N/A    Number of Children: N/A  . Years of Education: N/A   Social History Main Topics  . Smoking status: Never Smoker   . Smokeless tobacco: Never Used  . Alcohol Use: No  . Drug Use: No  . Sexual Activity: None   Other Topics Concern  . None   Social History Narrative     Review of Systems: A 12 point ROS discussed and pertinent positives are indicated in the HPI above.  All other systems are negative.  Review of Systems  Constitutional: Negative for fever, activity change, appetite change and unexpected weight change.  HENT: Negative for hearing loss and tinnitus.   Eyes: Negative for visual disturbance.  Respiratory: Negative for cough, chest tightness and shortness of breath.   Cardiovascular: Negative for chest pain.  Gastrointestinal: Negative for nausea, vomiting and abdominal pain.  Genitourinary: Negative for difficulty urinating.  Musculoskeletal: Negative for joint swelling and gait problem.  Neurological: Negative for dizziness, tremors, seizures, syncope, facial asymmetry, speech difficulty, weakness, light-headedness, numbness and headaches.  Psychiatric/Behavioral: Negative for behavioral problems and confusion.    Vital Signs: There were no vitals taken for this visit.  Physical Exam  Constitutional: She is oriented to person, place, and time. She appears well-nourished.  Eyes: EOM are normal.  Neck: Normal range of motion.  Cardiovascular: Normal rate, regular rhythm and normal heart sounds.   No murmur heard. Pulmonary/Chest: Effort normal and breath sounds normal. She has no wheezes.  Abdominal: Soft. Bowel sounds are normal. There is no tenderness.  Musculoskeletal: Normal range of motion. She exhibits no tenderness.  Neurological: She is alert and oriented to person, place, and time.  Skin: Skin is warm and dry.  Psychiatric: She has a  normal mood and affect. Her behavior is normal. Judgment and thought content normal.  Nursing note and vitals reviewed.   Imaging: No results found.  Labs:  CBC:  Recent Labs  08/17/14 1545  WBC 4.0  HGB 12.5  HCT 37.9  PLT 184    COAGS:  Recent Labs  08/17/14 1545  INR 1.05  APTT 34    BMP:  Recent Labs  05/18/14 1352 08/17/14 1545  NA  --  140  K  --  3.7  CL  --  102  CO2  --  25  GLUCOSE  --  94  BUN  --  17  CALCIUM  --  9.6  CREATININE 0.98 0.72  GFRNONAA 57* 85*  GFRAA 66* >90    LIVER FUNCTION TESTS:  Recent Labs  08/17/14 1545  BILITOT 0.6  AST 19  ALT 13  ALKPHOS 118*  PROT 7.9  ALBUMIN 3.7    TUMOR MARKERS: No results for input(s): AFPTM, CEA, CA199, CHROMGRNA in the last 8760 hours.  Assessment and Plan:  Pt with hx L para ophthalmic artery aneurysm coiling 2001 Additional coils 2007 Also known R ICA small aneurysm Recent MRI/MRA 05/2014  reveals neck remnant at L Para ophthalmic artery aneurysm with prominence of 2 small L ICA aneurysms Now scheduled for cerebral arteriogram with possible flow diverter placement L ICA Pt aware of procedure benefits and risks and agreeable to proceed Consent signed andin chart Pt understands if intervention is performed she will be admitted overnight to Neuro ICU and plan for discharge following day.  Thank you for this interesting consult.  I greatly enjoyed meeting Meredith [name] and look forward to participating in their care.    I spent a total of 20 minutes face to face in clinical consultation, greater than 50% of which was counseling/coordinating care for cerebral arteriogram with L ICA aneurysm embolization  Signed: TURPIN,PAMELA A 08/21/2014, 7:50 AM

## 2014-08-22 ENCOUNTER — Encounter (HOSPITAL_COMMUNITY): Payer: Self-pay | Admitting: Interventional Radiology

## 2014-08-24 ENCOUNTER — Ambulatory Visit (HOSPITAL_COMMUNITY)
Admission: RE | Admit: 2014-08-24 | Discharge: 2014-08-24 | Disposition: A | Discharge status: Home / Self Care | Payer: Medicare Other | Source: Ambulatory Visit | Attending: Interventional Radiology | Admitting: Interventional Radiology

## 2014-08-24 ENCOUNTER — Other Ambulatory Visit: Payer: Self-pay | Admitting: Radiology

## 2014-08-24 DIAGNOSIS — I671 Cerebral aneurysm, nonruptured: Secondary | ICD-10-CM | POA: Insufficient documentation

## 2014-08-24 DIAGNOSIS — I729 Aneurysm of unspecified site: Secondary | ICD-10-CM

## 2014-08-24 DIAGNOSIS — Z48812 Encounter for surgical aftercare following surgery on the circulatory system: Secondary | ICD-10-CM | POA: Diagnosis not present

## 2014-08-24 DIAGNOSIS — Z5309 Procedure and treatment not carried out because of other contraindication: Secondary | ICD-10-CM | POA: Insufficient documentation

## 2014-08-24 DIAGNOSIS — I10 Essential (primary) hypertension: Secondary | ICD-10-CM | POA: Insufficient documentation

## 2014-08-24 DIAGNOSIS — Z9889 Other specified postprocedural states: Secondary | ICD-10-CM | POA: Insufficient documentation

## 2014-08-24 DIAGNOSIS — E039 Hypothyroidism, unspecified: Secondary | ICD-10-CM | POA: Insufficient documentation

## 2014-08-24 DIAGNOSIS — Z7902 Long term (current) use of antithrombotics/antiplatelets: Secondary | ICD-10-CM | POA: Insufficient documentation

## 2014-08-24 DIAGNOSIS — Z7982 Long term (current) use of aspirin: Secondary | ICD-10-CM | POA: Insufficient documentation

## 2014-08-24 LAB — PLATELET INHIBITION P2Y12: Platelet Function  P2Y12: 199 [PRU] (ref 194–418)

## 2014-08-29 ENCOUNTER — Other Ambulatory Visit: Payer: Self-pay | Admitting: Radiology

## 2014-08-29 ENCOUNTER — Encounter (HOSPITAL_COMMUNITY): Payer: Self-pay | Admitting: *Deleted

## 2014-08-29 NOTE — Anesthesia Preprocedure Evaluation (Addendum)
Anesthesia Evaluation  Patient identified by MRN, date of birth, ID band Patient awake    Reviewed: Allergy & Precautions, H&P , NPO status , Patient's Chart, lab work & pertinent test results, reviewed documented beta blocker date and time   Airway Mallampati: II       Dental  (+) Edentulous Upper   Pulmonary  breath sounds clear to auscultation        Cardiovascular hypertension, Pt. on medications Rhythm:Regular     Neuro/Psych Multiple brain aneurysms, coiling in 2001 and 2007, now for coiling of L sided    GI/Hepatic   Endo/Other  Hypothyroidism   Renal/GU      Musculoskeletal   Abdominal (+)  Abdomen: soft.    Peds  Hematology   Anesthesia Other Findings   Reproductive/Obstetrics                            Anesthesia Physical Anesthesia Plan  ASA: III  Anesthesia Plan: MAC and General   Post-op Pain Management:    Induction:   Airway Management Planned: Nasal Cannula and Oral ETT  Additional Equipment:   Intra-op Plan:   Post-operative Plan:   Informed Consent: I have reviewed the patients History and Physical, chart, labs and discussed the procedure including the risks, benefits and alternatives for the proposed anesthesia with the patient or authorized representative who has indicated his/her understanding and acceptance.     Plan Discussed with:   Anesthesia Plan Comments:         Anesthesia Quick Evaluation

## 2014-08-30 ENCOUNTER — Encounter (HOSPITAL_COMMUNITY): Payer: Self-pay | Admitting: Certified Registered Nurse Anesthetist

## 2014-08-30 ENCOUNTER — Ambulatory Visit (HOSPITAL_COMMUNITY): Payer: Medicare Other | Admitting: Anesthesiology

## 2014-08-30 ENCOUNTER — Ambulatory Visit (HOSPITAL_COMMUNITY)
Admission: RE | Admit: 2014-08-30 | Discharge: 2014-08-30 | Disposition: A | Discharge status: Home / Self Care | Payer: Medicare Other | Source: Ambulatory Visit | Attending: Interventional Radiology | Admitting: Interventional Radiology

## 2014-08-30 ENCOUNTER — Encounter (HOSPITAL_COMMUNITY)
Admission: RE | Disposition: A | Discharge status: Home / Self Care | Payer: Self-pay | Source: Ambulatory Visit | Attending: Interventional Radiology

## 2014-08-30 ENCOUNTER — Inpatient Hospital Stay (HOSPITAL_COMMUNITY)
Admission: RE | Admit: 2014-08-30 | Discharge: 2014-08-31 | DRG: 027 | Disposition: A | Discharge status: Home / Self Care | Payer: Medicare Other | Source: Ambulatory Visit | Attending: Interventional Radiology | Admitting: Interventional Radiology

## 2014-08-30 DIAGNOSIS — I671 Cerebral aneurysm, nonruptured: Secondary | ICD-10-CM | POA: Diagnosis present

## 2014-08-30 DIAGNOSIS — Z79899 Other long term (current) drug therapy: Secondary | ICD-10-CM

## 2014-08-30 DIAGNOSIS — Z7902 Long term (current) use of antithrombotics/antiplatelets: Secondary | ICD-10-CM | POA: Diagnosis not present

## 2014-08-30 DIAGNOSIS — Z7982 Long term (current) use of aspirin: Secondary | ICD-10-CM

## 2014-08-30 DIAGNOSIS — I10 Essential (primary) hypertension: Secondary | ICD-10-CM | POA: Diagnosis present

## 2014-08-30 DIAGNOSIS — E039 Hypothyroidism, unspecified: Secondary | ICD-10-CM | POA: Diagnosis present

## 2014-08-30 HISTORY — PX: RADIOLOGY WITH ANESTHESIA: SHX6223

## 2014-08-30 LAB — CBC WITH DIFFERENTIAL/PLATELET
BASOS ABS: 0 10*3/uL (ref 0.0–0.1)
BASOS PCT: 0 % (ref 0–1)
EOS ABS: 0 10*3/uL (ref 0.0–0.7)
Eosinophils Relative: 0 % (ref 0–5)
HCT: 34.2 % — ABNORMAL LOW (ref 36.0–46.0)
Hemoglobin: 11.1 g/dL — ABNORMAL LOW (ref 12.0–15.0)
LYMPHS ABS: 1.3 10*3/uL (ref 0.7–4.0)
Lymphocytes Relative: 19 % (ref 12–46)
MCH: 29.7 pg (ref 26.0–34.0)
MCHC: 32.5 g/dL (ref 30.0–36.0)
MCV: 91.4 fL (ref 78.0–100.0)
Monocytes Absolute: 0.3 10*3/uL (ref 0.1–1.0)
Monocytes Relative: 4 % (ref 3–12)
NEUTROS PCT: 77 % (ref 43–77)
Neutro Abs: 5.3 10*3/uL (ref 1.7–7.7)
Platelets: 154 10*3/uL (ref 150–400)
RBC: 3.74 MIL/uL — AB (ref 3.87–5.11)
RDW: 13.3 % (ref 11.5–15.5)
WBC: 6.9 10*3/uL (ref 4.0–10.5)

## 2014-08-30 LAB — COMPREHENSIVE METABOLIC PANEL
ALBUMIN: 3.2 g/dL — AB (ref 3.5–5.2)
ALT: 11 U/L (ref 0–35)
AST: 14 U/L (ref 0–37)
Alkaline Phosphatase: 109 U/L (ref 39–117)
Anion gap: 13 (ref 5–15)
BUN: 10 mg/dL (ref 6–23)
CO2: 23 mEq/L (ref 19–32)
Calcium: 8.9 mg/dL (ref 8.4–10.5)
Chloride: 106 mEq/L (ref 96–112)
Creatinine, Ser: 0.62 mg/dL (ref 0.50–1.10)
GFR calc Af Amer: >90 mL/min (ref >90)
GFR calc non Af Amer: 89 mL/min — ABNORMAL LOW (ref >90)
Glucose, Bld: 116 mg/dL — ABNORMAL HIGH (ref 70–99)
POTASSIUM: 3.9 meq/L (ref 3.7–5.3)
SODIUM: 142 meq/L (ref 137–147)
TOTAL PROTEIN: 6.7 g/dL (ref 6.0–8.3)
Total Bilirubin: 0.6 mg/dL (ref 0.3–1.2)

## 2014-08-30 LAB — MRSA PCR SCREENING: MRSA by PCR: NEGATIVE

## 2014-08-30 LAB — PLATELET INHIBITION P2Y12: Platelet Function  P2Y12: 164 [PRU] — ABNORMAL LOW (ref 194–418)

## 2014-08-30 LAB — POCT ACTIVATED CLOTTING TIME
ACTIVATED CLOTTING TIME: 180 s
ACTIVATED CLOTTING TIME: 185 s
Activated Clotting Time: 168 seconds

## 2014-08-30 LAB — HEPARIN LEVEL (UNFRACTIONATED): Heparin Unfractionated: 0.18 IU/mL — ABNORMAL LOW (ref 0.30–0.70)

## 2014-08-30 LAB — APTT: aPTT: 73 seconds — ABNORMAL HIGH (ref 24–37)

## 2014-08-30 LAB — PROTIME-INR
INR: 1.1 (ref 0.00–1.49)
PROTHROMBIN TIME: 14.4 s (ref 11.6–15.2)

## 2014-08-30 SURGERY — RADIOLOGY WITH ANESTHESIA
Anesthesia: Monitor Anesthesia Care

## 2014-08-30 MED ORDER — NIMODIPINE 30 MG PO CAPS: 60.0000 mg | ORAL_CAPSULE | ORAL | Status: DC

## 2014-08-30 MED ORDER — CLOPIDOGREL BISULFATE 75 MG PO TABS
75.0000 mg | ORAL_TABLET | Freq: Every day | ORAL | Status: DC
  Administered 2014-08-31: 75 mg via ORAL
  Filled 2014-08-30 (×2): qty 1

## 2014-08-30 MED ORDER — CEFAZOLIN SODIUM-DEXTROSE 2-3 GM-% IV SOLR
2.0000 g | Freq: Once | INTRAVENOUS | Status: AC
  Administered 2014-08-30: 2 g via INTRAVENOUS
  Filled 2014-08-30: qty 50

## 2014-08-30 MED ORDER — HEPARIN SODIUM (PORCINE) 1000 UNIT/ML IJ SOLN
INTRAMUSCULAR | Status: DC | PRN
  Administered 2014-08-30 (×2): .5 mL via INTRAVENOUS
  Administered 2014-08-30: 3 mL via INTRAVENOUS

## 2014-08-30 MED ORDER — PROMETHAZINE HCL 25 MG/ML IJ SOLN: 6.2500 mg | INTRAMUSCULAR | Status: DC | PRN

## 2014-08-30 MED ORDER — HEPARIN (PORCINE) IN NACL 100-0.45 UNIT/ML-% IJ SOLN
600.0000 [IU]/h | INTRAMUSCULAR | Status: DC
  Administered 2014-08-30: 500 [IU]/h via INTRAVENOUS
  Filled 2014-08-30: qty 250

## 2014-08-30 MED ORDER — MEPERIDINE HCL 25 MG/ML IJ SOLN
6.2500 mg | INTRAMUSCULAR | Status: DC | PRN
Start: 2014-08-30 — End: 2014-08-30

## 2014-08-30 MED ORDER — SODIUM CHLORIDE 0.9 % IV SOLN: Freq: Once | INTRAVENOUS | Status: DC

## 2014-08-30 MED ORDER — LACTATED RINGERS IV SOLN
INTRAVENOUS | Status: DC | PRN
  Administered 2014-08-30 (×3): via INTRAVENOUS

## 2014-08-30 MED ORDER — HEPARIN (PORCINE) IN NACL 100-0.45 UNIT/ML-% IJ SOLN
INTRAMUSCULAR | Status: AC
  Filled 2014-08-30: qty 250

## 2014-08-30 MED ORDER — FENTANYL CITRATE 0.05 MG/ML IJ SOLN
INTRAMUSCULAR | Status: DC | PRN
  Administered 2014-08-30: 25 ug via INTRAVENOUS
  Administered 2014-08-30 (×4): 50 ug via INTRAVENOUS
  Administered 2014-08-30: 25 ug via INTRAVENOUS

## 2014-08-30 MED ORDER — ASPIRIN 325 MG PO TABS
325.0000 mg | ORAL_TABLET | Freq: Every day | ORAL | Status: DC
  Administered 2014-08-31: 325 mg via ORAL
  Filled 2014-08-30 (×2): qty 1

## 2014-08-30 MED ORDER — ACETAMINOPHEN 650 MG RE SUPP: 650.0000 mg | Freq: Four times a day (QID) | RECTAL | Status: DC | PRN

## 2014-08-30 MED ORDER — ONDANSETRON HCL 4 MG/2ML IJ SOLN: 4.0000 mg | Freq: Four times a day (QID) | INTRAMUSCULAR | Status: DC | PRN

## 2014-08-30 MED ORDER — LABETALOL HCL 5 MG/ML IV SOLN
INTRAVENOUS | Status: DC | PRN
  Administered 2014-08-30: 5 mg via INTRAVENOUS

## 2014-08-30 MED ORDER — MIDAZOLAM HCL 5 MG/5ML IJ SOLN
INTRAMUSCULAR | Status: DC | PRN
  Administered 2014-08-30: 2 mg via INTRAVENOUS

## 2014-08-30 MED ORDER — ACETAMINOPHEN 500 MG PO TABS: 1000.0000 mg | ORAL_TABLET | Freq: Four times a day (QID) | ORAL | Status: DC | PRN

## 2014-08-30 MED ORDER — LIDOCAINE HCL (CARDIAC) 20 MG/ML IV SOLN
INTRAVENOUS | Status: DC | PRN
  Administered 2014-08-30: 80 mg via INTRAVENOUS

## 2014-08-30 MED ORDER — EPHEDRINE SULFATE 50 MG/ML IJ SOLN
INTRAMUSCULAR | Status: DC | PRN
  Administered 2014-08-30: 7.5 mg via INTRAVENOUS
  Administered 2014-08-30: 5 mg via INTRAVENOUS

## 2014-08-30 MED ORDER — CLOPIDOGREL BISULFATE 75 MG PO TABS: 75.0000 mg | ORAL_TABLET | ORAL | Status: DC

## 2014-08-30 MED ORDER — MIDAZOLAM HCL 2 MG/2ML IJ SOLN
INTRAMUSCULAR | Status: AC
  Filled 2014-08-30: qty 2

## 2014-08-30 MED ORDER — SODIUM CHLORIDE 0.9 % IV SOLN: INTRAVENOUS | Status: DC

## 2014-08-30 MED ORDER — CEFAZOLIN SODIUM-DEXTROSE 2-3 GM-% IV SOLR: 2.0000 g | Freq: Once | INTRAVENOUS | Status: DC

## 2014-08-30 MED ORDER — ROCURONIUM BROMIDE 100 MG/10ML IV SOLN
INTRAVENOUS | Status: DC | PRN
  Administered 2014-08-30: 10 mg via INTRAVENOUS
  Administered 2014-08-30: 15 mg via INTRAVENOUS
  Administered 2014-08-30: 35 mg via INTRAVENOUS

## 2014-08-30 MED ORDER — LIDOCAINE HCL 1 % IJ SOLN
INTRAMUSCULAR | Status: AC
  Filled 2014-08-30: qty 20

## 2014-08-30 MED ORDER — HYDRALAZINE HCL 20 MG/ML IJ SOLN
INTRAMUSCULAR | Status: DC | PRN
  Administered 2014-08-30: 10 mg via INTRAVENOUS

## 2014-08-30 MED ORDER — NITROGLYCERIN 1 MG/10 ML FOR IR/CATH LAB
INTRA_ARTERIAL | Status: AC
  Filled 2014-08-30: qty 10

## 2014-08-30 MED ORDER — NICARDIPINE HCL IN NACL 20-0.86 MG/200ML-% IV SOLN: 5.0000 mg/h | INTRAVENOUS | Status: DC

## 2014-08-30 MED ORDER — NIMODIPINE 30 MG PO CAPS
60.0000 mg | ORAL_CAPSULE | ORAL | Status: AC
  Administered 2014-08-30: 60 mg via ORAL
  Filled 2014-08-30 (×2): qty 2

## 2014-08-30 MED ORDER — NEOSTIGMINE METHYLSULFATE 10 MG/10ML IV SOLN
INTRAVENOUS | Status: DC | PRN
  Administered 2014-08-30: 4 mg via INTRAVENOUS

## 2014-08-30 MED ORDER — ASPIRIN EC 325 MG PO TBEC: 325.0000 mg | DELAYED_RELEASE_TABLET | ORAL | Status: DC

## 2014-08-30 MED ORDER — FENTANYL CITRATE 0.05 MG/ML IJ SOLN: 25.0000 ug | INTRAMUSCULAR | Status: DC | PRN

## 2014-08-30 MED ORDER — LACTATED RINGERS IV SOLN
INTRAVENOUS | Status: DC | PRN
  Administered 2014-08-30: 08:00:00 via INTRAVENOUS

## 2014-08-30 MED ORDER — PROPOFOL 10 MG/ML IV BOLUS
INTRAVENOUS | Status: DC | PRN
  Administered 2014-08-30: 130 mg via INTRAVENOUS
  Administered 2014-08-30: 50 mg via INTRAVENOUS

## 2014-08-30 MED ORDER — SODIUM CHLORIDE 0.9 % IV SOLN
INTRAVENOUS | Status: DC
  Administered 2014-08-31: 01:00:00 via INTRAVENOUS

## 2014-08-30 MED ORDER — NICARDIPINE HCL IN NACL 20-0.86 MG/200ML-% IV SOLN
INTRAVENOUS | Status: DC | PRN
  Administered 2014-08-30: 1 mg/h via INTRAVENOUS

## 2014-08-30 MED ORDER — ONDANSETRON HCL 4 MG/2ML IJ SOLN
INTRAMUSCULAR | Status: DC | PRN
  Administered 2014-08-30: 4 mg via INTRAVENOUS

## 2014-08-30 MED ORDER — IOHEXOL 300 MG/ML  SOLN
150.0000 mL | Freq: Once | INTRAMUSCULAR | Status: AC | PRN
  Administered 2014-08-30: 200 mL via INTRA_ARTERIAL

## 2014-08-30 MED ORDER — GLYCOPYRROLATE 0.2 MG/ML IJ SOLN
INTRAMUSCULAR | Status: DC | PRN
  Administered 2014-08-30: 0.6 mg via INTRAVENOUS

## 2014-08-30 MED ORDER — FENTANYL CITRATE 0.05 MG/ML IJ SOLN
INTRAMUSCULAR | Status: AC
  Filled 2014-08-30: qty 5

## 2014-08-30 MED ORDER — ASPIRIN EC 325 MG PO TBEC: 325.0000 mg | DELAYED_RELEASE_TABLET | Freq: Once | ORAL | Status: DC

## 2014-08-30 NOTE — Procedures (Signed)
S/P Bilateral common carotid arteriograms, followed  By embolization of partially recanalized Large  Lt peri ophthalmic aneurysm and wide necked bilobed/2 periophthalmic aneurysms using pipeline flow diverter device. Rt superior ophthalmic aneurysm 4 x 3 mm stable

## 2014-08-30 NOTE — Progress Notes (Signed)
Rt groin pressure drsg cdi

## 2014-08-30 NOTE — Sedation Documentation (Signed)
Patient to IR2 readied for procedure. Anesthesia present and will perform the sedation and anesthesia.

## 2014-08-30 NOTE — H&P (Signed)
Chief Complaint: Left internal carotid artery aneurysms  Referring Physician(s): Dr. Adelene Amas Mitchell  History of Present Illness: Meredith [name] is a 68 y.o. female  who is status post endovascular treatment of a large left paraophthalmic region aneurysm in 2001 which subsequently required retreatment with primary coiling 2007. At that time was also discovered to have at least 2 smaller aneurysms just proximal to this in the left paraophthalmic region. 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 A  follow-up MRI/MRA of the brain done on 05/18/2014 demonstrated interval slight progression of the large left paraophthalmic region aneurysm at its neck with contrast noted within it. Additionally the slightly more proximal left paraophthalmic region aneurysms demonstrated mild prominence of the larger of the 2. This measures approximately 4.5 mm x 4 mm. 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 She presents today for cerebral arteriogram with possible endovascular treatment of the left ICA region aneurysms in the form of a flow diverter.   Past Medical History  Diagnosis Date  . Hypertension   . Hypothyroidism   . Arthritis   . Brain aneurysm     have 4 now, last saw dr Marcheta Grammesdevashar 2006, last scan 02-20-2006 epic  . Aneurysm     Past Surgical History  Procedure Laterality Date  . Aneurysm coiling   yrs ago    x 1 done  . Joint replacement Bilateral one in 2007 and one in 2010    knee  . Colonoscopy with propofol N/A 04/11/2014    Procedure: COLONOSCOPY WITH PROPOFOL;  Surgeon: Charolett BumpersMartin K Johnson, MD;  Location: [facility];  Service: Endoscopy;  Laterality: N/A;  . Eye surgery Bilateral     Cataracts removed  .  Radiology with anesthesia N/A 08/21/2014    Procedure: Embolization;  Surgeon: Oneal GroutSanjeev K Skylar Priest, MD;  [facility];  Service: Radiology;  Laterality: N/A;    Allergies: Review of patient's allergies indicates no known allergies.  Medications: Prior to Admission medications   Medication Sig Start Date End Date Taking? Authorizing Provider  aspirin EC 81 MG tablet Take 325 mg by mouth daily.    Yes Historical Provider, MD  atorvastatin (LIPITOR) 40 MG tablet Take 40 mg by mouth every evening.   Yes Historical Provider, MD  clopidogrel (PLAVIX) 75 MG tablet Take 150 mg by mouth daily.  06/14/14  Yes Historical Provider, MD  hydrochlorothiazide (HYDRODIURIL) 25 MG tablet Take 25 mg by mouth every morning.   Yes Historical Provider, MD  levothyroxine (SYNTHROID, LEVOTHROID) 175 MCG tablet Take 175 mcg by mouth daily before breakfast.   Yes Historical Provider, MD    History reviewed. No pertinent family history.  History   Social History  . Marital Status: Widowed    Spouse Name: N/A    Number of Children: N/A  . Years of Education: N/A   Social History Main Topics  . Smoking status: Never Smoker   . Smokeless tobacco: Never Used  . Alcohol Use: No  . Drug Use: No  . Sexual Activity: None   Other Topics Concern  . None   Social History Narrative         Review of Systems    Constitutional: Negative for fever and chills.  HENT: Positive for  congestion and sinus pressure.   Respiratory: Negative for shortness of breath.        Occ cough  Cardiovascular: Negative for chest pain.  Gastrointestinal: Negative for nausea, vomiting, abdominal pain and blood in stool.  Genitourinary: Negative for dysuria and hematuria.  Musculoskeletal: Negative for back pain.  Neurological: Negative for dizziness, syncope, speech difficulty, weakness, light-headedness, numbness and headaches.  Hematological: Does not bruise/bleed easily.    Vital Signs: BP 160/66 mmHg  Pulse 73   Temp(Src) 98.7 F (37.1 C) (Oral)  Resp 20  Ht 5\' 2"  (1.575 m)  Wt 214 lb (97.07 kg)  BMI 39.13 kg/m2  SpO2 99%  Physical Exam  Constitutional: She is oriented to person, place, and time. She appears well-developed and well-nourished.  HENT:  Head: Normocephalic and atraumatic.  Eyes: EOM are normal. Pupils are equal, round, and reactive to light.  Neck: Normal range of motion.  Cardiovascular: Normal rate and regular rhythm.   Pulmonary/Chest: Effort normal and breath sounds normal.  Abdominal: Soft. Bowel sounds are normal. There is no tenderness.  Musculoskeletal: Normal range of motion.  Trace LE edema  Neurological: She is alert and oriented to person, place, and time. No cranial nerve deficit. Coordination normal.  Skin: Skin is warm and dry.  Psychiatric: She has a normal mood and affect.    Imaging: No results found.  Labs:  CBC:  Recent Labs  08/17/14 1545  WBC 4.0  HGB 12.5  HCT 37.9  PLT 184    COAGS:  Recent Labs  08/17/14 1545  INR 1.05  APTT 34    BMP:  Recent Labs  05/18/14 1352 08/17/14 1545  NA  --  140  K  --  3.7  CL  --  102  CO2  --  25  GLUCOSE  --  94  BUN  --  17  CALCIUM  --  9.6  CREATININE 0.98 0.72  GFRNONAA 57* 85*  GFRAA 66* >90    LIVER FUNCTION TESTS:  Recent Labs  08/17/14 1545  BILITOT 0.6  AST 19  ALT 13  ALKPHOS 118*  PROT 7.9  ALBUMIN 3.7  08/30/14  p2y12  164  TUMOR MARKERS: No results for input(s): AFPTM, CEA, CA199, CHROMGRNA in the last 8760 hours.  Assessment and Plan: Meredith [name] is a 68 y.o. female  who is status post endovascular treatment of a large left paraophthalmic region aneurysm in 2001 which subsequently required retreatment with primary coiling 2007. At that time was also discovered to have at least 2 smaller aneurysms just proximal to this in the left paraophthalmic region. Also noted was an approximately 4.5 mm x 4 mm right internal carotid artery paraophthalmic  aneurysm.The patient was then lost to follow-up. A  follow-up MRI/MRA of the brain done on 05/18/2014 demonstrated interval slight progression of the large left paraophthalmic region aneurysm at its neck with contrast noted within it. Additionally the slightly more proximal left paraophthalmic region aneurysms demonstrated mild prominence of the larger of the 2. This measures approximately 4.5 mm x 4 mm. 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 She presents today for cerebral arteriogram with possible endovascular treatment of the left ICA region aneurysms in the form of a flow diverter. Details/risks of procedure d/w pt with her understanding and consent. If endovascular treatment is performed  pt will be admitted to neuro ICU for overnight observation.         I spent a total of 20 minutes face to face in clinical consultation, greater than 50% of which was counseling/coordinating care for cerebral arteriogram with possible endovascular treatment of left ICA aneurysms.  Signed: Chinita Pester 08/30/2014, 7:48 AM

## 2014-08-30 NOTE — Progress Notes (Addendum)
ANTICOAGULATION CONSULT NOTE - Initial Consult  Pharmacy Consult for heparin Indication: s/p endovascular procedure with Dr. Corliss Skainseveshwar  No Known Allergies  Patient Measurements: Height: 5\' 2"  (157.5 cm) Weight: 214 lb (97.07 kg) IBW/kg (Calculated) : 50.1 Heparin Dosing Weight: 73kg  Vital Signs: Temp: 98.2 F (36.8 C) (11/18 1245) Temp Source: Oral (11/18 0642) BP: 160/66 mmHg (11/18 0642) Pulse Rate: 73 (11/18 0642)  Labs: No results for input(s): HGB, HCT, PLT, APTT, LABPROT, INR, HEPARINUNFRC, CREATININE, CKTOTAL, CKMB, TROPONINI in the last 72 hours.  Estimated Creatinine Clearance: 71.2 mL/min (by C-G formula based on Cr of 0.72).   Medical History: Past Medical History  Diagnosis Date  . Hypertension   . Hypothyroidism   . Arthritis   . Brain aneurysm     have 4 now, last saw dr Marcheta Grammesdevashar 2006, last scan 02-20-2006 epic  . Aneurysm    Assessment: 68 YOF s/p BL common carotid arteriograms and embolization of partially recanulized large peri opthalmixc aneurysm. To start heparin post procedure at 500 units/hr.   Goal of Therapy:  Heparin level 0.1-0.25 units/ml aPTT 60-70 seconds Monitor platelets by anticoagulation protocol: Yes   Plan:  1. Continue heparin at 500 units/hr as ordered 2. Check heparin level and aPTT 6 hours from start at 1900 tonight 3. Pharmacy will make adjustments based on levels 4. Heparin ordered to stop at 0700 tomorrow morning per protocol for sheath removal  Lauren D. Bajbus, PharmD, BCPS Clinical Pharmacist Pager: [phone] 08/30/2014 1:12 PM   Addendum: Pharmacy has been consulted to adjust initial heparin dosing (drip is started at 500 units/hr post-op and then pharmacy to adjust for goal heparin level of 0.1-0.25). - Heparin weight: 73kg  Plan: 1. Increase heparin drip to 600 units/hr (6 ml/hr) 2. Check heparin level ~6 hours after rate increase 3. Heparin ordered to stop at 0700 tomorrow morning per protocol for sheath  removal  Wilfred LacyWesley Pier Laux, PharmD Clinical Pharmacist [phone] 08/30/2014, 3:22 PM

## 2014-08-30 NOTE — Anesthesia Postprocedure Evaluation (Signed)
  Anesthesia Post-op Note  Patient: Meredith [name]  Procedure(s) Performed: Procedure(s): EMBOLIZATION (N/A)  Patient Location: PACU  Anesthesia Type:General  Level of Consciousness: awake  Airway and Oxygen Therapy: Patient Spontanous Breathing  Post-op Pain: none  Post-op Assessment: Post-op Vital signs reviewed  Post-op Vital Signs: Reviewed  Last Vitals:  Filed Vitals:   08/30/14 1323  BP:   Pulse: 80  Temp: 37.1 C  Resp: 17    Complications: No apparent anesthesia complications

## 2014-08-30 NOTE — Transfer of Care (Signed)
Immediate Anesthesia Transfer of Care Note  Patient: Annye RuskFrances L Eddinger  Procedure(s) Performed: Procedure(s): EMBOLIZATION (N/A)  Patient Location: PACU  Anesthesia Type:General  Level of Consciousness: awake, alert  and oriented  Airway & Oxygen Therapy: Patient Spontanous Breathing and Patient connected to nasal cannula oxygen  Post-op Assessment: Report given to PACU RN and Post -op Vital signs reviewed and stable  Post vital signs: Reviewed and stable  Complications: No apparent anesthesia complications and Dr. Berneice HeinrichManny notified of nicardipine drip in place, PACU RN informed of heparin drip to be started

## 2014-08-30 NOTE — Progress Notes (Signed)
ANTICOAGULATION CONSULT NOTE - Follow Up Consult  Pharmacy Consult for Heparin Indication: s/p endovascular procedure  No Known Allergies  Patient Measurements: Height: 5\' 2"  (157.5 cm) Weight: 217 lb 6 oz (98.6 kg) IBW/kg (Calculated) : 50.1 Heparin Dosing Weight: 73kg  Vital Signs: Temp: 98.2 F (36.8 C) (11/18 2000) Temp Source: Oral (11/18 2000) BP: 125/55 mmHg (11/18 2115) Pulse Rate: 67 (11/18 2115)  Labs:  Recent Labs  08/30/14 1920 08/30/14 2100  HGB 11.1*  --   HCT 34.2*  --   PLT 154  --   APTT 73*  --   LABPROT 14.4  --   INR 1.10  --   HEPARINUNFRC  --  0.18*  CREATININE 0.62  --     Estimated Creatinine Clearance: 71.8 mL/min (by C-G formula based on Cr of 0.62).   Medications:  Heparin drip 600 units/hr  Assessment: Meredith [name] s/p BL common carotid arteriograms and embolization of partially recanulized large peri opthalmixc aneurysm. Initial heparin level (0.18) therapeutic with reduced range (0.1-0.25) - will continue current rate and follow-up AM labs. - Hg 11.1, Plts 154 - No significant bleeding reported  Goal of Therapy:  Heparin level 0.1-0.25 units/ml Monitor platelets by anticoagulation protocol: Yes   Plan:  1. Continue heparin drip 600 units/hr (6 ml/hr) - discontinue @ 0700 tomorrow for sheath removal per MD order 2. Follow-up AM labs and sheath removal  Meredith [name], Meredith [name]  [phone] 08/30/2014,9:34 PM

## 2014-08-30 NOTE — Progress Notes (Signed)
  Subjective:  Post  Procedure . More awake ,alert. Denies H/S,N/V,visual ,or motor or sensory symptoms..Sore throat. Denies any chest pains or SOB. On Iv heparin  And cardene IV  Objective: Vital signs in last 24 hours: Temp:  [98.2 F (36.8 C)-98.8 F (37.1 C)] 98.8 F (37.1 C) (11/18 1323) Pulse Rate:  [73-80] 77 (11/18 1500) Resp:  [12-28] 16 (11/18 1500) BP: (116-160)/(48-66) 121/55 mmHg (11/18 1500) SpO2:  [95 %-99 %] 95 % (11/18 1500) Arterial Line BP: (123-152)/(46-53) 152/53 mmHg (11/18 1500) Weight:  [214 lb (97.07 kg)-217 lb 6 oz (98.6 kg)] 217 lb 6 oz (98.6 kg) (11/18 1400)    Intake/Output from previous day:   Intake/Output this shift: Total I/O In: 71.2 [I.V.:71.2] Out: 1060 [Urine:1050; Blood:10]  On Exam. VS  BP 120s /60s. HR 70s SR . PaO2 > 95% RA.  On exam. Alert, awake oriented to time ,place and space.Marland Kitchen.  Speech and comprehension clear.Marland Kitchen.  PEARLA..3mm R = L.  EOMS full..No  Nystagmus  Or diplopia.  Visual Fields.Full to confrontation No Facial asymmetry.  Tongue Midline..  Motor..           NO drift of outstretched arms..          Power.5/5 Proximally and distally all four extremities..  Fine motor and coordination to finger to nose equal..  Gait . Not tested  Romberg. Not tested Heel to toe. Not tested.  Rt groin soft. No tenderness or palpable hematoma.Pulses +.  Lab Results:  No results for input(s): WBC, HGB, HCT, PLT in the last 72 hours. BMET No results for input(s): NA, [name], CL, CO2, GLUCOSE, BUN, CREATININE, CALCIUM in the last 72 hours. PT/INR No results for input(s): LABPROT, INR in the last 72 hours. ABG No results for input(s): PHART, HCO3 in the last 72 hours.  Invalid input(s): PCO2, PO2  Studies/Results: No results found.  Anti-infectives: Anti-infectives    Start     Dose/Rate Route Frequency Ordered Stop   08/30/14 0745  ceFAZolin (ANCEF) IVPB 2 g/50 mL premix     2 g100 mL/hr over 30 Minutes Intravenous   Once 08/30/14 0734 08/30/14 0915   08/30/14 0715  ceFAZolin (ANCEF) IVPB 2 g/50 mL premix  Status:  Discontinued     2 g100 mL/hr over 30 Minutes Intravenous  Once 08/30/14 0701 08/30/14 [id number]      Assessment/Plan: s/p Embolization of Lt ICA multiple aneurysms. Using the pipeline  Flow diverter device.  Plan.. Cont close neuro obs, ,IV heparin and IV cardene as needed.. Advance diet to clear liquids as tolerated.. D/W patient and family  Meredith [name] 08/30/2014

## 2014-08-30 NOTE — Sedation Documentation (Signed)
516fr sheath pulled and exoseal device inserted. Sterile gauze and tegaderm placed to RFA site. No hematoma or bleeding noted.

## 2014-08-31 LAB — CBC WITH DIFFERENTIAL/PLATELET
Basophils Absolute: 0 10*3/uL (ref 0.0–0.1)
Basophils Relative: 0 % (ref 0–1)
Eosinophils Absolute: 0.1 10*3/uL (ref 0.0–0.7)
Eosinophils Relative: 1 % (ref 0–5)
HCT: 32.7 % — ABNORMAL LOW (ref 36.0–46.0)
Hemoglobin: 10.3 g/dL — ABNORMAL LOW (ref 12.0–15.0)
Lymphocytes Relative: 15 % (ref 12–46)
Lymphs Abs: 1.2 10*3/uL (ref 0.7–4.0)
MCH: 29.3 pg (ref 26.0–34.0)
MCHC: 31.5 g/dL (ref 30.0–36.0)
MCV: 92.9 fL (ref 78.0–100.0)
Monocytes Absolute: 0.4 10*3/uL (ref 0.1–1.0)
Monocytes Relative: 5 % (ref 3–12)
Neutro Abs: 6.1 10*3/uL (ref 1.7–7.7)
Neutrophils Relative %: 79 % — ABNORMAL HIGH (ref 43–77)
Platelets: 143 10*3/uL — ABNORMAL LOW (ref 150–400)
RBC: 3.52 MIL/uL — ABNORMAL LOW (ref 3.87–5.11)
RDW: 13.6 % (ref 11.5–15.5)
WBC: 7.7 10*3/uL (ref 4.0–10.5)

## 2014-08-31 LAB — BASIC METABOLIC PANEL
Anion gap: 12 (ref 5–15)
BUN: 9 mg/dL (ref 6–23)
CHLORIDE: 106 meq/L (ref 96–112)
CO2: 24 mEq/L (ref 19–32)
Calcium: 8.7 mg/dL (ref 8.4–10.5)
Creatinine, Ser: 0.68 mg/dL (ref 0.50–1.10)
GFR calc non Af Amer: 87 mL/min — ABNORMAL LOW (ref >90)
GLUCOSE: 110 mg/dL — AB (ref 70–99)
Potassium: 3.9 mEq/L (ref 3.7–5.3)
Sodium: 142 mEq/L (ref 137–147)

## 2014-08-31 LAB — PLATELET INHIBITION P2Y12: Platelet Function  P2Y12: 170 [PRU] — ABNORMAL LOW (ref 194–418)

## 2014-08-31 LAB — HEPARIN LEVEL (UNFRACTIONATED)

## 2014-08-31 MED ORDER — LEVOTHYROXINE SODIUM 175 MCG PO TABS
175.0000 ug | ORAL_TABLET | Freq: Every day | ORAL | Status: DC
  Administered 2014-08-31: 175 ug via ORAL
  Filled 2014-08-31 (×2): qty 1

## 2014-08-31 MED ORDER — ATORVASTATIN CALCIUM 40 MG PO TABS
40.0000 mg | ORAL_TABLET | Freq: Every evening | ORAL | Status: DC
  Filled 2014-08-31: qty 1

## 2014-08-31 MED ORDER — HYDROCHLOROTHIAZIDE 25 MG PO TABS
25.0000 mg | ORAL_TABLET | Freq: Every morning | ORAL | Status: DC
  Administered 2014-08-31: 25 mg via ORAL
  Filled 2014-08-31: qty 1

## 2014-08-31 NOTE — Progress Notes (Signed)
UR completed.  Camron Monday, RN BSN [facility] CCM Trauma/Neuro ICU Case Manager [phone]  

## 2014-08-31 NOTE — Plan of Care (Signed)
Problem: Phase I Progression Outcomes Goal: Initial discharge plan identified Outcome: Completed/Met Date Met:  08/31/14     

## 2014-08-31 NOTE — Plan of Care (Signed)
Problem: Phase I Progression Outcomes Goal: Distal pulses equal to baseline Outcome: Completed/Met Date Met:  08/31/14

## 2014-08-31 NOTE — Plan of Care (Signed)
Problem: Consults Goal: Vascular Cath Int Patient Education (See Patient Education module for education specifics.) Outcome: Progressing     

## 2014-08-31 NOTE — Plan of Care (Signed)
Problem: Phase II Progression Outcomes Goal: Ambulates up to 600 ft. in hall x 1 Outcome: Completed/Met Date Met:  08/31/14

## 2014-08-31 NOTE — Plan of Care (Signed)
Problem: Consults Goal: Vascular Cath Int Patient Education (See Patient Education module for education specifics.)  Outcome: Completed/Met Date Met:  08/31/14

## 2014-08-31 NOTE — Plan of Care (Signed)
Problem: Phase II Progression Outcomes Goal: Tolerates diet Outcome: Completed/Met Date Met:  08/31/14

## 2014-08-31 NOTE — Plan of Care (Signed)
Problem: Phase I Progression Outcomes Goal: Hemodynamically stable Outcome: Completed/Met Date Met:  08/31/14

## 2014-08-31 NOTE — Plan of Care (Signed)
Problem: Phase I Progression Outcomes Goal: Vascular site scale level 0 - I Vascular Site Scale Level 0: No bruising/bleeding/hematoma Level I (Mild): Bruising/Ecchymosis, minimal bleeding/ooozing, palpable hematoma < 3 cm Level II (Moderate): Bleeding not affecting hemodynamic parameters, pseudoaneurysm, palpable hematoma > 3 cm Level III (Severe) Bleeding which affects hemodynamic parameters or retroperitoneal hemorrhage  Outcome: Completed/Met Date Met:  08/31/14     

## 2014-08-31 NOTE — Plan of Care (Signed)
Problem: Consults Goal: Skin Care Protocol Initiated - if Braden Score 18 or less If consults are not indicated, leave blank or document N/A Outcome: Completed/Met Date Met:  08/31/14     

## 2014-08-31 NOTE — Plan of Care (Signed)
Problem: Phase II Progression Outcomes Goal: Activity appropriate for discharge plan Outcome: Completed/Met Date Met:  08/31/14

## 2014-08-31 NOTE — Plan of Care (Signed)
Problem: Consults Goal: Skin Care Protocol Initiated - if Braden Score 18 or less If consults are not indicated, leave blank or document N/A Outcome: Adequate for Discharge Goal: Nutrition Consult-if indicated Outcome: Adequate for Discharge  Problem: Phase I Progression Outcomes Goal: Distal pulses equal to baseline Outcome: Adequate for Discharge Goal: Vascular site scale level 0 - I Vascular Site Scale Level 0: No bruising/bleeding/hematoma Level I (Mild): Bruising/Ecchymosis, minimal bleeding/ooozing, palpable hematoma < 3 cm Level II (Moderate): Bleeding not affecting hemodynamic parameters, pseudoaneurysm, palpable hematoma > 3 cm Level III (Severe) Bleeding which affects hemodynamic parameters or retroperitoneal hemorrhage  Outcome: Adequate for Discharge

## 2014-08-31 NOTE — Plan of Care (Signed)
Problem: Phase I Progression Outcomes Goal: Other Phase I Outcomes/Goals Outcome: Completed/Met Date Met:  08/31/14     

## 2014-08-31 NOTE — Plan of Care (Signed)
Problem: Spiritual Needs Goal: Ability to function at adequate level Outcome: Completed/Met Date Met:  08/31/14

## 2014-08-31 NOTE — Plan of Care (Signed)
Problem: Phase I Progression Outcomes Goal: Pain controlled with appropriate interventions Outcome: Completed/Met Date Met:  08/31/14     

## 2014-08-31 NOTE — Plan of Care (Signed)
Problem: Consults Goal: Tobacco Cessation referral if indicated Outcome: Not Applicable Date Met:  50/27/74

## 2014-08-31 NOTE — Plan of Care (Signed)
Problem: Phase II Progression Outcomes Goal: Pain controlled with appropriate interventions Outcome: Completed/Met Date Met:  08/31/14

## 2014-08-31 NOTE — Plan of Care (Signed)
Problem: Phase I Progression Outcomes Goal: Voiding-avoid urinary catheter unless indicated Outcome: Completed/Met Date Met:  08/31/14     

## 2014-08-31 NOTE — Plan of Care (Signed)
Problem: Phase II Progression Outcomes Goal: Complications resolved/controlled Outcome: Completed/Met Date Met:  08/31/14

## 2014-08-31 NOTE — Plan of Care (Signed)
Problem: Phase II Progression Outcomes Goal: Other Discharge Outcomes/Goals Outcome: Completed/Met Date Met:  08/31/14

## 2014-08-31 NOTE — Progress Notes (Signed)
   Referring Physician(s): Danni Shima  Subjective:  L ICA aneurysms pipeline flow diverter stent placed 11/18 Overnight without complaints Denies N/V No vision issues Denies headache  Allergies: Review of patient's allergies indicates no known allergies.  Medications: Prior to Admission medications   Medication Sig Start Date End Date Taking? Authorizing Provider  aspirin EC 81 MG tablet Take 325 mg by mouth daily.    Yes Historical Provider, MD  atorvastatin (LIPITOR) 40 MG tablet Take 40 mg by mouth every evening.   Yes Historical Provider, MD  clopidogrel (PLAVIX) 75 MG tablet Take 150 mg by mouth daily.  06/14/14  Yes Historical Provider, MD  hydrochlorothiazide (HYDRODIURIL) 25 MG tablet Take 25 mg by mouth every morning.   Yes Historical Provider, MD  levothyroxine (SYNTHROID, LEVOTHROID) 175 MCG tablet Take 175 mcg by mouth daily before breakfast.   Yes Historical Provider, MD    Review of Systems  Vital Signs: BP 118/52 mmHg  Pulse 70  Temp(Src) 98.7 F (37.1 C) (Oral)  Resp 14  Ht 5\' 2"  (1.575 m)  Wt 98.6 kg (217 lb 6 oz)  BMI 39.75 kg/m2  SpO2 94%  Physical Exam  Pulmonary/Chest: Effort normal and breath sounds normal. She has no wheezes.  Abdominal:  Rt groin NT; no bleeding No hematoma Rt foot 2+ pulses  Neurological:  Afeb; vss Appropriate Smile = Face symmetrical Good strength; good sensation     Imaging: No results found.  Labs:  CBC:  Recent Labs  08/17/14 1545 08/30/14 1920 08/31/14 0425  WBC 4.0 6.9 7.7  HGB 12.5 11.1* 10.3*  HCT 37.9 34.2* 32.7*  PLT 184 154 143*    COAGS:  Recent Labs  08/17/14 1545 08/30/14 1920  INR 1.05 1.10  APTT 34 73*    BMP:  Recent Labs  05/18/14 1352 08/17/14 1545 08/30/14 1920 08/31/14 0425  NA  --  140 142 142  K  --  3.7 3.9 3.9  CL  --  102 106 106  CO2  --  25 23 24   GLUCOSE  --  94 116* 110*  BUN  --  17 10 9   CALCIUM  --  9.6 8.9 8.7  CREATININE 0.98 0.72 0.62 0.68    GFRNONAA 57* 85* 89* 87*  GFRAA 66* >90 >90 >90    LIVER FUNCTION TESTS:  Recent Labs  08/17/14 1545 08/30/14 1920  BILITOT 0.6 0.6  AST 19 14  ALT 13 11  ALKPHOS 118* 109  PROT 7.9 6.7  ALBUMIN 3.7 3.2*    Assessment and Plan:  L internal carotid artery aneurysms Low diverter pipeline stent placed 11/18 Doing well this am Plan for dc to home today Dr Corliss Skainseveshwar to see pt before dc    I spent a total of 20 minutes face to face in clinical consultation/evaluation, greater than 50% of which was counseling/coordinating care for L ICA aneurysms--stent placed  Signed: TURPIN,PAMELA A 08/31/2014, 7:32 AM

## 2014-08-31 NOTE — Plan of Care (Signed)
Problem: Consults Goal: Nutrition Consult-if indicated Outcome: Completed/Met Date Met:  08/31/14

## 2014-08-31 NOTE — Plan of Care (Signed)
Problem: Phase II Progression Outcomes Goal: Discharge plan in place and appropriate Outcome: Completed/Met Date Met:  08/31/14

## 2014-08-31 NOTE — Plan of Care (Signed)
Problem: Phase II Progression Outcomes Goal: Barriers To Progression Addressed/Resolved Outcome: Completed/Met Date Met:  08/31/14

## 2014-08-31 NOTE — Plan of Care (Signed)
Problem: Phase II Progression Outcomes Goal: Vascular site scale level 0 - I Vascular Site Scale Level 0: No bruising/bleeding/hematoma Level I (Mild): Bruising/Ecchymosis, minimal bleeding/ooozing, palpable hematoma < 3 cm Level II (Moderate): Bleeding not affecting hemodynamic parameters, pseudoaneurysm, palpable hematoma > 3 cm Level III (Severe) Bleeding which affects hemodynamic parameters or retroperitoneal hemorrhage  Outcome: Completed/Met Date Met:  08/31/14

## 2014-08-31 NOTE — Plan of Care (Signed)
Problem: Phase II Progression Outcomes Goal: Hemodynamically stable Outcome: Completed/Met Date Met:  08/31/14

## 2014-08-31 NOTE — Discharge Summary (Signed)
Patient ID: Meredith RuskFrances L Pio MRN: [id number] DOB/AGE: 1946-03-22 68 y.o.  Admit date: 08/30/2014 Discharge date: 08/31/2014  Admission Diagnoses: Left Internal carotid artery aneurysms  Discharge Diagnoses: Embolization of L ICA aneurysms Active Problems:   Aneurysm of left internal carotid artery   Brain aneurysm   Discharged Condition: stable; improved  Hospital Course: Pt with hx of L paraopthalmic artery aneurysm coiling 2001 and additional coils 2007. Known two other aneurysms of L Internal carotid artery. Consulted with Dr Corliss Skainseveshwar then scheduled for cerebral arteriogram with pipeline flow diverter stent placement 08/30/2014.  Procedure went without complication. Admitted to Neuro ICU overnight. Has done well. No complaints this am. Eating well; No N/V. Has urinated and had BM on own. Ambulating without issue. Denies headache or pain. Denies speech or vision problems. Dr Corliss Skainseveshwar has seen and examined pt this am. Plan for p2y12 before discharge. Continue Plavix 75 mg BID and ASA 325 mg daily. Continue all home meds. Follow up with Dr Corliss Skainseveshwar 2 weeks.   Consults: None  Significant Diagnostic Studies: Cerebral arteriogram  Treatments: L internal carotid artery aneurysm flow diverter device stent placement  Discharge Exam: Blood pressure 125/55, pulse 85, temperature 99 F (37.2 C), temperature source Oral, resp. rate 17, height 5\' 2"  (1.575 m), weight 98.6 kg (217 lb 6 oz), SpO2 96 %.  PE:  Afeb; vss Appropriate Pleasant Face symmetrical; smile = EOM; A/O Heart: RRR w/o M Lungs: CTA Abd: soft; +BS; NT Extr: FROM; good sensation; good strength B Rt groin: NT; no bleeding; no hematoma Rt foot: 2+ pulses UOP good; yellow  Results for orders placed or performed during the hospital encounter of 08/30/14  MRSA PCR Screening  Result Value Ref Range   MRSA by PCR NEGATIVE NEGATIVE  APTT  Result Value Ref Range   aPTT 73 (H) 24 - 37 seconds  Heparin level  (unfractionated)  Result Value Ref Range   Heparin Unfractionated 0.18 (L) 0.30 - 0.70 IU/mL  Basic metabolic panel  Result Value Ref Range   Sodium 142 137 - 147 mEq/L   Potassium 3.9 3.7 - 5.3 mEq/L   Chloride 106 96 - 112 mEq/L   CO2 24 19 - 32 mEq/L   Glucose, Bld 110 (H) 70 - 99 mg/dL   BUN 9 6 - 23 mg/dL   Creatinine, Ser 4.090.68 0.50 - 1.10 mg/dL   Calcium 8.7 8.4 - 81.110.5 mg/dL   GFR calc non Af Amer 87 (L) >90 mL/min   GFR calc Af Amer >90 >90 mL/min   Anion gap 12 5 - 15  CBC WITH DIFFERENTIAL  Result Value Ref Range   WBC 7.7 4.0 - 10.5 K/uL   RBC 3.52 (L) 3.87 - 5.11 MIL/uL   Hemoglobin 10.3 (L) 12.0 - 15.0 g/dL   HCT 91.432.7 (L) 78.236.0 - 95.646.0 %   MCV 92.9 78.0 - 100.0 fL   MCH 29.3 26.0 - 34.0 pg   MCHC 31.5 30.0 - 36.0 g/dL   RDW 21.313.6 08.611.5 - 57.815.5 %   Platelets 143 (L) 150 - 400 K/uL   Neutrophils Relative % 79 (H) 43 - 77 %   Neutro Abs 6.1 1.7 - 7.7 K/uL   Lymphocytes Relative 15 12 - 46 %   Lymphs Abs 1.2 0.7 - 4.0 K/uL   Monocytes Relative 5 3 - 12 %   Monocytes Absolute 0.4 0.1 - 1.0 K/uL   Eosinophils Relative 1 0 - 5 %   Eosinophils Absolute 0.1 0.0 - 0.7  K/uL   Basophils Relative 0 0 - 1 %   Basophils Absolute 0.0 0.0 - 0.1 K/uL  Protime-INR  Result Value Ref Range   Prothrombin Time 14.4 11.6 - 15.2 seconds   INR 1.10 0.00 - 1.49  Heparin level (unfractionated)  Result Value Ref Range   Heparin Unfractionated <0.10 (L) 0.30 - 0.70 IU/mL     Disposition: L ICA aneurysms Flow diverter device stent placement in IR 08/30/14 Resume all home meds Plavix 75 mg BID; ASA 325 mg daily Follow up with Dr Corliss Skainseveshwar in 2 weeks---she willl hear from scheduler with time and date Pt has good understanding of discharge instructions Rx Plavix #30; 2 refills  Discharge Instructions    Call MD for:  difficulty breathing, headache or visual disturbances    Complete by:  As directed      Call MD for:  extreme fatigue    Complete by:  As directed      Call MD for:   hives    Complete by:  As directed      Call MD for:  persistant dizziness or light-headedness    Complete by:  As directed      Call MD for:  persistant nausea and vomiting    Complete by:  As directed      Call MD for:  redness, tenderness, or signs of infection (pain, swelling, redness, odor or green/yellow discharge around incision site)    Complete by:  As directed      Call MD for:  severe uncontrolled pain    Complete by:  As directed      Call MD for:  temperature >100.4    Complete by:  As directed      Diet - low sodium heart healthy    Complete by:  As directed      Discharge instructions    Complete by:  As directed   Resume all home meds; Plavix 75 mg BID; ASA 325 mg daily; follow up 2 weeks with Dr Corliss Skainseveshwar; scheduler will call with appt; call [phone] if questions/concerns     Discharge wound care:    Complete by:  As directed   Leave new band aid on Rt groin site daily x 1 week     Driving Restrictions    Complete by:  As directed   No driving x 2 weeks     Increase activity slowly    Complete by:  As directed      Lifting restrictions    Complete by:  As directed   No lifting over 10 lbs x 2 weeks            Medication List    TAKE these medications        aspirin EC 81 MG tablet  Take 325 mg by mouth daily.     atorvastatin 40 MG tablet  Commonly known as:  LIPITOR  Take 40 mg by mouth every evening.     clopidogrel 75 MG tablet  Commonly known as:  PLAVIX  Take 150 mg by mouth daily.     hydrochlorothiazide 25 MG tablet  Commonly known as:  HYDRODIURIL  Take 25 mg by mouth every morning.     levothyroxine 175 MCG tablet  Commonly known as:  SYNTHROID, LEVOTHROID  Take 175 mcg by mouth daily before breakfast.         Signed: Robet Leuamela A Turpin [facility]  08/31/2014, 12:03 PM

## 2014-09-01 ENCOUNTER — Encounter (HOSPITAL_COMMUNITY): Payer: Self-pay | Admitting: Interventional Radiology

## 2014-09-13 ENCOUNTER — Ambulatory Visit (HOSPITAL_COMMUNITY)
Admission: RE | Admit: 2014-09-13 | Discharge: 2014-09-13 | Disposition: A | Discharge status: Home / Self Care | Payer: Medicare Other | Source: Ambulatory Visit | Attending: Radiology | Admitting: Radiology

## 2014-09-13 DIAGNOSIS — Z8679 Personal history of other diseases of the circulatory system: Secondary | ICD-10-CM | POA: Insufficient documentation

## 2014-09-13 DIAGNOSIS — Z7902 Long term (current) use of antithrombotics/antiplatelets: Secondary | ICD-10-CM | POA: Diagnosis not present

## 2014-09-13 DIAGNOSIS — Z7982 Long term (current) use of aspirin: Secondary | ICD-10-CM | POA: Diagnosis not present

## 2014-09-13 DIAGNOSIS — I671 Cerebral aneurysm, nonruptured: Secondary | ICD-10-CM

## 2014-09-13 DIAGNOSIS — Z48812 Encounter for surgical aftercare following surgery on the circulatory system: Secondary | ICD-10-CM | POA: Insufficient documentation

## 2014-09-13 LAB — PLATELET INHIBITION P2Y12: PLATELET FUNCTION P2Y12: 1 [PRU] — AB (ref 194–418)

## 2014-11-19 ENCOUNTER — Encounter (HOSPITAL_COMMUNITY): Payer: Self-pay | Admitting: *Deleted

## 2014-11-19 ENCOUNTER — Emergency Department (HOSPITAL_COMMUNITY)
Admission: EM | Admit: 2014-11-19 | Discharge: 2014-11-19 | Disposition: A | Discharge status: Home / Self Care | Payer: Medicare Other | Attending: Emergency Medicine | Admitting: Emergency Medicine

## 2014-11-19 DIAGNOSIS — R040 Epistaxis: Secondary | ICD-10-CM | POA: Insufficient documentation

## 2014-11-19 DIAGNOSIS — M1990 Unspecified osteoarthritis, unspecified site: Secondary | ICD-10-CM | POA: Diagnosis not present

## 2014-11-19 DIAGNOSIS — Z8679 Personal history of other diseases of the circulatory system: Secondary | ICD-10-CM | POA: Diagnosis not present

## 2014-11-19 DIAGNOSIS — Z7902 Long term (current) use of antithrombotics/antiplatelets: Secondary | ICD-10-CM | POA: Insufficient documentation

## 2014-11-19 DIAGNOSIS — I10 Essential (primary) hypertension: Secondary | ICD-10-CM | POA: Diagnosis not present

## 2014-11-19 DIAGNOSIS — Z7982 Long term (current) use of aspirin: Secondary | ICD-10-CM | POA: Insufficient documentation

## 2014-11-19 DIAGNOSIS — E039 Hypothyroidism, unspecified: Secondary | ICD-10-CM | POA: Diagnosis not present

## 2014-11-19 MED ORDER — TRAMADOL HCL 50 MG PO TABS: 50.0000 mg | ORAL_TABLET | Freq: Four times a day (QID) | ORAL | Status: DC | PRN

## 2014-11-19 MED ORDER — AMOXICILLIN 500 MG PO CAPS: 1000.0000 mg | ORAL_CAPSULE | Freq: Two times a day (BID) | ORAL | Status: DC

## 2014-11-19 NOTE — ED Provider Notes (Signed)
CSN: [id number]     Arrival date & time 11/19/14  0413 History   First MD Initiated Contact with Patient 11/19/14 0424     Chief Complaint  Patient presents with  . Epistaxis     (Consider location/radiation/quality/duration/timing/severity/associated sxs/prior Treatment) The history is provided by the patient.   69 year old female has had a right-sided nosebleed since about 10 PM. She states she was cleaning out her nose when it started bleeding and she's been unable to stop it. She's not had problems with nosebleeds before. She is on antiplatelet medication of aspirin and clopidogrel. She is not on any other anticoagulants.  Past Medical History  Diagnosis Date  . Hypertension   . Hypothyroidism   . Arthritis   . Brain aneurysm     have 4 now, last saw dr Marcheta Grammes 2006, last scan 02-20-2006 epic  . Aneurysm    Past Surgical History  Procedure Laterality Date  . Aneurysm coiling   yrs ago    x 1 done  . Joint replacement Bilateral one in 2007 and one in 2010    knee  . Colonoscopy with propofol N/A 04/11/2014    Procedure: COLONOSCOPY WITH PROPOFOL;  Surgeon: Charolett Bumpers, MD;  Location: [facility] END[facility]COPY;  Service: Endoscopy;  Laterality: N/A;  . Eye surgery Bilateral     Cataracts removed  . Radiology with anesthesia N/A 08/21/2014    Procedure: Embolization;  Surgeon: Oneal Grout, MD;  Location: [facility] OR;  Service: Radiology;  Laterality: N/A;  . Radiology with anesthesia N/A 08/30/2014    Procedure: EMBOLIZATION;  Surgeon: Oneal Grout, MD;  Location: [facility] OR;  Service: Radiology;  Laterality: N/A;   No family history on file. History  Substance Use Topics  . Smoking status: Never Smoker   . Smokeless tobacco: Never Used  . Alcohol Use: No   OB History    No data available     Review of Systems  All other systems reviewed and are negative.     Allergies  Review of patient's allergies indicates no known allergies.  Home Medications   Prior to  Admission medications   Medication Sig Start Date End Date Taking? Authorizing Provider  aspirin EC 81 MG tablet Take 325 mg by mouth daily.     Historical Provider, MD  atorvastatin (LIPITOR) 40 MG tablet Take 40 mg by mouth every evening.    Historical Provider, MD  clopidogrel (PLAVIX) 75 MG tablet Take 150 mg by mouth daily.  06/14/14   Historical Provider, MD  hydrochlorothiazide (HYDRODIURIL) 25 MG tablet Take 25 mg by mouth every morning.    Historical Provider, MD  levothyroxine (SYNTHROID, LEVOTHROID) 175 MCG tablet Take 175 mcg by mouth daily before breakfast.    Historical Provider, MD   BP 149/72 mmHg  Pulse 71  Temp(Src) 98 F (36.7 C)  Resp 20  Ht  (1.549 m)  Wt 220 lb (99.791 kg)  BMI 41.59 kg/m2  SpO2 98% Physical Exam  Nursing note and vitals reviewed.  69 year old female, resting comfortably and in no acute distress. Vital signs are significant for mild hypertension. Oxygen saturation is 98%, which is normal. Head is normocephalic and atraumatic. PERRLA, EOMI. Oropharynx is clear. First blood is seen in the right side of the nose. No bleeding site is seen along the nasal septum. Examination with nasal speculum and headlamp shows bleeding site from the lateral aspect of the nose.  Neck is nontender and supple without adenopathy or JVD. Back  is nontender and there is no CVA tenderness. Lungs are clear without rales, wheezes, or rhonchi. Chest is nontender. Heart has regular rate and rhythm without murmur. Abdomen is soft, flat, nontender without masses or hepatosplenomegaly and peristalsis is normoactive. Extremities have no cyanosis or edema, full range of motion is present. Skin is warm and dry without rash. Neurologic: Mental status is normal, cranial nerves are intact, there are no motor or sensory deficits.  ED Course  EPISTAXIS MANAGEMENT Date/Time: 11/19/2014 4:35 AM Performed by: Dione BoozeGLICK, Kenyon Eshleman Authorized by: Preston FleetingGLICK, Kadin Bera Consent: Verbal consent obtained.  Written consent not obtained. Risks and benefits: risks, benefits and alternatives were discussed Consent given by: patient Patient understanding: patient states understanding of the procedure being performed Patient consent: the patient's understanding of the procedure matches consent given Procedure consent: procedure consent matches procedure scheduled Relevant documents: relevant documents present and verified Test results: test results available and properly labeled Site marked: the operative site was marked Required items: required blood products, implants, devices, and special equipment available Patient identity confirmed: verbally with patient and arm band Time out: Immediately prior to procedure a "time out" was called to verify the correct patient, procedure, equipment, support staff and site/side marked as required. Anesthesia method: None. Patient sedated: no Treatment site: right anterior Repair method: nasal balloon (7.5 cm Rapid Rhino) Post-procedure assessment: bleeding stopped Treatment complexity: simple Patient tolerance: Patient tolerated the procedure well with no immediate complications   (including critical care time)   MDM   Final diagnoses:  Anterior epistaxis    Right-sided epistaxis but not from the nasal septum. Rapid Rhino is inserted with good control of bleeding. Patient is observed in the ED.  After 1.5 hours, there was no recurrence of bleeding and she is discharged with prescriptions for tramadol and amoxicillin. She is advised to leave the Rapid Rhino in place for 3 days and have it removed by either her PCP or the on-call ENT physician.    Dione Boozeavid Madelene Kaatz, MD 11/19/14 [phone]

## 2014-11-19 NOTE — Discharge Instructions (Signed)
Nosebleed Nosebleeds can be caused by many conditions, including trauma, infections, polyps, foreign bodies, dry mucous membranes or climate, medicines, and air conditioning. Most nosebleeds occur in the front of the nose. Because of this location, most nosebleeds can be controlled by pinching the nostrils gently and continuously for at least 10 to 20 minutes. The long, continuous pressure allows enough time for the blood to clot. If pressure is released during that 10 to 20 minute time period, the process may have to be started again. The nosebleed may stop by itself or quit with pressure, or it may need concentrated heating (cautery) or pressure from packing. HOME CARE INSTRUCTIONS   If your nose was packed, try to maintain the pack inside until your health care provider removes it. If a gauze pack was used and it starts to fall out, gently replace it or cut the end off. Do not cut if a balloon catheter was used to pack the nose. Otherwise, do not remove unless instructed.  Avoid blowing your nose for 12 hours after treatment. This could dislodge the pack or clot and start the bleeding again.  If the bleeding starts again, sit up and bend forward, gently pinching the front half of your nose continuously for 20 minutes.  If bleeding was caused by dry mucous membranes, use over-the-counter saline nasal spray or gel. This will keep the mucous membranes moist and allow them to heal. If you must use a lubricant, choose the water-soluble variety. Use it only sparingly and not within several hours of lying down.  Do not use petroleum jelly or mineral oil, as these may drip into the lungs and cause serious problems.  Maintain humidity in your home by using less air conditioning or by using a humidifier.  Do not use aspirin or medicines which make bleeding more likely. Your health care provider can give you recommendations on this.  Resume normal activities as you are able, but try to avoid straining,  lifting, or bending at the waist for several days.  If the nosebleeds become recurrent and the cause is unknown, your health care provider may suggest laboratory tests. SEEK MEDICAL CARE IF: You have a fever. SEEK IMMEDIATE MEDICAL CARE IF:   Bleeding recurs and cannot be controlled.  There is unusual bleeding from or bruising on other parts of the body.  Nosebleeds continue.  There is any worsening of the condition which originally brought you in.  You become light-headed, feel faint, become sweaty, or vomit blood. MAKE SURE YOU:   Understand these instructions.  Will watch your condition.  Will get help right away if you are not doing well or get worse. Document Released: 07/09/2005 Document Revised: 02/13/2014 Document Reviewed: 08/30/2009 [facility] Patient Information 2015 [place], [place]. This information is not intended to replace advice given to you by your health care provider. Make sure you discuss any questions you have with your health care provider.  Tramadol tablets What is this medicine? TRAMADOL (TRA ma dole) is a pain reliever. It is used to treat moderate to severe pain in adults. This medicine may be used for other purposes; ask your health care provider or pharmacist if you have questions. COMMON BRAND NAME(S): Ultram What should I tell my health care provider before I take this medicine? They need to know if you have any of these conditions: -brain tumor -depression -drug abuse or addiction -head injury -if you frequently drink alcohol containing drinks -kidney disease or trouble passing urine -liver disease -lung disease, asthma, or  breathing problems -seizures or epilepsy -suicidal thoughts, plans, or attempt; a previous suicide attempt by you or a family member -an unusual or allergic reaction to tramadol, codeine, other medicines, foods, dyes, or preservatives -pregnant or trying to get pregnant -breast-feeding How should I use this medicine? Take  this medicine by mouth with a full glass of water. Follow the directions on the prescription label. If the medicine upsets your stomach, take it with food or milk. Do not take more medicine than you are told to take. Talk to your pediatrician regarding the use of this medicine in children. Special care may be needed. Overdosage: If you think you have taken too much of this medicine contact a poison control center or emergency room at once. NOTE: This medicine is only for you. Do not share this medicine with others. What if I miss a dose? If you miss a dose, take it as soon as you can. If it is almost time for your next dose, take only that dose. Do not take double or extra doses. What may interact with this medicine? Do not take this medicine with any of the following medications: -MAOIs like Carbex, Eldepryl, Marplan, Nardil, and Parnate This medicine may also interact with the following medications: -alcohol or medicines that contain alcohol -antihistamines -benzodiazepines -bupropion -carbamazepine or oxcarbazepine -clozapine -cyclobenzaprine -digoxin -furazolidone -linezolid -medicines for depression, anxiety, or psychotic disturbances -medicines for migraine headache like almotriptan, eletriptan, frovatriptan, naratriptan, rizatriptan, sumatriptan, zolmitriptan -medicines for pain like pentazocine, buprenorphine, butorphanol, meperidine, nalbuphine, and propoxyphene -medicines for sleep -muscle relaxants -naltrexone -phenobarbital -phenothiazines like perphenazine, thioridazine, chlorpromazine, mesoridazine, fluphenazine, prochlorperazine, promazine, and trifluoperazine -procarbazine -warfarin This list may not describe all possible interactions. Give your health care provider a list of all the medicines, herbs, non-prescription drugs, or dietary supplements you use. Also tell them if you smoke, drink alcohol, or use illegal drugs. Some items may interact with your medicine. What  should I watch for while using this medicine? Tell your doctor or health care professional if your pain does not go away, if it gets worse, or if you have new or a different type of pain. You may develop tolerance to the medicine. Tolerance means that you will need a higher dose of the medicine for pain relief. Tolerance is normal and is expected if you take this medicine for a long time. Do not suddenly stop taking your medicine because you may develop a severe reaction. Your body becomes used to the medicine. This does NOT mean you are addicted. Addiction is a behavior related to getting and using a drug for a non-medical reason. If you have pain, you have a medical reason to take pain medicine. Your doctor will tell you how much medicine to take. If your doctor wants you to stop the medicine, the dose will be slowly lowered over time to avoid any side effects. You may get drowsy or dizzy. Do not drive, use machinery, or do anything that needs mental alertness until you know how this medicine affects you. Do not stand or sit up quickly, especially if you are an older patient. This reduces the risk of dizzy or fainting spells. Alcohol can increase or decrease the effects of this medicine. Avoid alcoholic drinks. You may have constipation. Try to have a bowel movement at least every 2 to 3 days. If you do not have a bowel movement for 3 days, call your doctor or health care professional. Your mouth may get dry. Chewing sugarless gum or sucking hard candy,  and drinking plenty of water may help. Contact your doctor if the problem does not go away or is severe. What side effects may I notice from receiving this medicine? Side effects that you should report to your doctor or health care professional as soon as possible: -allergic reactions like skin rash, itching or hives, swelling of the face, lips, or tongue -breathing difficulties, wheezing -confusion -itching -light headedness or fainting  spells -redness, blistering, peeling or loosening of the skin, including inside the mouth -seizures Side effects that usually do not require medical attention (report to your doctor or health care professional if they continue or are bothersome): -constipation -dizziness -drowsiness -headache -nausea, vomiting This list may not describe all possible side effects. Call your doctor for medical advice about side effects. You may report side effects to FDA at 1-800-FDA-1088. Where should I keep my medicine? Keep out of the reach of children. Store at room temperature between 15 and 30 degrees C (59 and 86 degrees F). Keep container tightly closed. Throw away any unused medicine after the expiration date. NOTE: This sheet is a summary. It may not cover all possible information. If you have questions about this medicine, talk to your doctor, pharmacist, or health care provider.  2015, Elsevier/Gold Standard. (2010-06-12 11:55:44)  Amoxicillin capsules or tablets What is this medicine? AMOXICILLIN (a mox i SIL in) is a penicillin antibiotic. It is used to treat certain kinds of bacterial infections. It will not work for colds, flu, or other viral infections. This medicine may be used for other purposes; ask your health care provider or pharmacist if you have questions. COMMON BRAND NAME(S): Amoxil, Moxilin, Sumox, Trimox What should I tell my health care provider before I take this medicine? They need to know if you have any of these conditions: -asthma -kidney disease -an unusual or allergic reaction to amoxicillin, other penicillins, cephalosporin antibiotics, other medicines, foods, dyes, or preservatives -pregnant or trying to get pregnant -breast-feeding How should I use this medicine? Take this medicine by mouth with a glass of water. Follow the directions on your prescription label. You may take this medicine with food or on an empty stomach. Take your medicine at regular intervals. Do  not take your medicine more often than directed. Take all of your medicine as directed even if you think your are better. Do not skip doses or stop your medicine early. Talk to your pediatrician regarding the use of this medicine in children. While this drug may be prescribed for selected conditions, precautions do apply. Overdosage: If you think you have taken too much of this medicine contact a poison control center or emergency room at once. NOTE: This medicine is only for you. Do not share this medicine with others. What if I miss a dose? If you miss a dose, take it as soon as you can. If it is almost time for your next dose, take only that dose. Do not take double or extra doses. What may interact with this medicine? -amiloride -birth control pills -chloramphenicol -macrolides -probenecid -sulfonamides -tetracyclines This list may not describe all possible interactions. Give your health care provider a list of all the medicines, herbs, non-prescription drugs, or dietary supplements you use. Also tell them if you smoke, drink alcohol, or use illegal drugs. Some items may interact with your medicine. What should I watch for while using this medicine? Tell your doctor or health care professional if your symptoms do not improve in 2 or 3 days. Take all of the doses  of your medicine as directed. Do not skip doses or stop your medicine early. If you are diabetic, you may get a false positive result for sugar in your urine with certain brands of urine tests. Check with your doctor. Do not treat diarrhea with over-the-counter products. Contact your doctor if you have diarrhea that lasts more than 2 days or if the diarrhea is severe and watery. What side effects may I notice from receiving this medicine? Side effects that you should report to your doctor or health care professional as soon as possible: -allergic reactions like skin rash, itching or hives, swelling of the face, lips, or  tongue -breathing problems -dark urine -redness, blistering, peeling or loosening of the skin, including inside the mouth -seizures -severe or watery diarrhea -trouble passing urine or change in the amount of urine -unusual bleeding or bruising -unusually weak or tired -yellowing of the eyes or skin Side effects that usually do not require medical attention (report to your doctor or health care professional if they continue or are bothersome): -dizziness -headache -stomach upset -trouble sleeping This list may not describe all possible side effects. Call your doctor for medical advice about side effects. You may report side effects to FDA at 1-800-FDA-1088. Where should I keep my medicine? Keep out of the reach of children. Store between 68 and 77 degrees F (20 and 25 degrees C). Keep bottle closed tightly. Throw away any unused medicine after the expiration date. NOTE: This sheet is a summary. It may not cover all possible information. If you have questions about this medicine, talk to your doctor, pharmacist, or health care provider.  2015, Elsevier/Gold Standard. (2007-12-21 14:10:59)

## 2014-11-19 NOTE — ED Notes (Signed)
No bleeding  From the nose

## 2014-11-19 NOTE — ED Notes (Signed)
The pts nose has been packed  The bleeding has  Stopped for the present.  She tolerated well

## 2014-11-19 NOTE — ED Notes (Signed)
The pt has had a niosebleed since 2200.  She is on plavix.  Nosebleeds in the past this one lasting longer

## 2014-12-01 ENCOUNTER — Telehealth (HOSPITAL_COMMUNITY): Payer: Self-pay | Admitting: Interventional Radiology

## 2014-12-01 NOTE — Telephone Encounter (Signed)
Pt called and states that she took her last Plavix 75mg  tablet on Friday, November 24, 2014. She was taking Plavix 75mg  2 tablets daily along with Aspirin 81mg  1 daily. She wanted to know what Deveshwar wanted her to do concerning the Plavix. I told her to come by the hospital at her earliest convenience so that we could get a P2Y12 study to check her platelet inhibitions. She says she will come on Monday, December 04, 2014 for her blood draw. JM

## 2014-12-04 ENCOUNTER — Other Ambulatory Visit: Payer: Self-pay

## 2014-12-04 ENCOUNTER — Telehealth (HOSPITAL_COMMUNITY): Payer: Self-pay | Admitting: Interventional Radiology

## 2014-12-04 ENCOUNTER — Other Ambulatory Visit: Payer: Self-pay | Admitting: Radiology

## 2014-12-04 DIAGNOSIS — Z1231 Encounter for screening mammogram for malignant neoplasm of breast: Secondary | ICD-10-CM

## 2014-12-04 LAB — PLATELET INHIBITION P2Y12: Platelet Function  P2Y12: 60 [PRU] — ABNORMAL LOW (ref 194–418)

## 2014-12-04 NOTE — Telephone Encounter (Signed)
Pt came in for P2Y12 study (12/04/14). Per Deveshwar, based on her results she should continue as is on Aspirin 325mg  1 QD and do not restart Plavix. Pt understands and agrees. JM

## 2014-12-13 ENCOUNTER — Ambulatory Visit
Admission: RE | Admit: 2014-12-13 | Discharge: 2014-12-13 | Disposition: A | Discharge status: Home / Self Care | Payer: Medicare Other | Source: Ambulatory Visit

## 2014-12-13 DIAGNOSIS — Z1231 Encounter for screening mammogram for malignant neoplasm of breast: Secondary | ICD-10-CM

## 2015-11-26 ENCOUNTER — Other Ambulatory Visit: Payer: Self-pay

## 2015-11-26 DIAGNOSIS — Z1231 Encounter for screening mammogram for malignant neoplasm of breast: Secondary | ICD-10-CM

## 2015-12-19 ENCOUNTER — Ambulatory Visit
Admission: RE | Admit: 2015-12-19 | Discharge: 2015-12-19 | Disposition: A | Discharge status: Home / Self Care | Payer: Medicare Other | Source: Ambulatory Visit

## 2015-12-19 DIAGNOSIS — Z1231 Encounter for screening mammogram for malignant neoplasm of breast: Secondary | ICD-10-CM

## 2016-11-13 ENCOUNTER — Other Ambulatory Visit: Payer: Self-pay | Admitting: Family Medicine

## 2016-11-13 DIAGNOSIS — Z1231 Encounter for screening mammogram for malignant neoplasm of breast: Secondary | ICD-10-CM

## 2016-11-24 ENCOUNTER — Telehealth (HOSPITAL_COMMUNITY): Payer: Self-pay

## 2016-11-24 NOTE — Telephone Encounter (Signed)
Called to schedule f/u, left message for pt to return call. AW 

## 2016-11-25 ENCOUNTER — Other Ambulatory Visit (HOSPITAL_COMMUNITY): Payer: Self-pay | Admitting: Interventional Radiology

## 2016-11-25 DIAGNOSIS — I729 Aneurysm of unspecified site: Secondary | ICD-10-CM

## 2016-12-09 ENCOUNTER — Encounter (HOSPITAL_COMMUNITY): Payer: Self-pay | Admitting: Radiology

## 2016-12-09 ENCOUNTER — Ambulatory Visit (HOSPITAL_COMMUNITY)
Admission: RE | Admit: 2016-12-09 | Discharge: 2016-12-09 | Disposition: A | Discharge status: Home / Self Care | Payer: Medicare Other | Source: Ambulatory Visit | Attending: Interventional Radiology | Admitting: Interventional Radiology

## 2016-12-09 DIAGNOSIS — I729 Aneurysm of unspecified site: Secondary | ICD-10-CM

## 2016-12-09 HISTORY — PX: IR GENERIC HISTORICAL: IMG1180011

## 2016-12-11 ENCOUNTER — Other Ambulatory Visit (HOSPITAL_COMMUNITY): Payer: Self-pay | Admitting: Interventional Radiology

## 2016-12-11 DIAGNOSIS — I729 Aneurysm of unspecified site: Secondary | ICD-10-CM

## 2016-12-19 ENCOUNTER — Encounter (HOSPITAL_COMMUNITY): Payer: Self-pay | Admitting: Interventional Radiology

## 2016-12-19 ENCOUNTER — Ambulatory Visit (HOSPITAL_COMMUNITY): Admission: RE | Admit: 2016-12-19 | Payer: Medicare Other | Source: Ambulatory Visit

## 2016-12-19 ENCOUNTER — Ambulatory Visit (HOSPITAL_COMMUNITY)
Admission: RE | Admit: 2016-12-19 | Discharge: 2016-12-19 | Disposition: A | Discharge status: Home / Self Care | Payer: Medicare Other | Source: Ambulatory Visit | Attending: Interventional Radiology | Admitting: Interventional Radiology

## 2016-12-19 DIAGNOSIS — I671 Cerebral aneurysm, nonruptured: Secondary | ICD-10-CM | POA: Insufficient documentation

## 2016-12-19 DIAGNOSIS — I728 Aneurysm of other specified arteries: Secondary | ICD-10-CM | POA: Diagnosis not present

## 2016-12-19 DIAGNOSIS — I729 Aneurysm of unspecified site: Secondary | ICD-10-CM

## 2016-12-19 LAB — POCT I-STAT CREATININE: CREATININE: 0.7 mg/dL (ref 0.44–1.00)

## 2016-12-19 MED ORDER — GADOBENATE DIMEGLUMINE 529 MG/ML IV SOLN
20.0000 mL | Freq: Once | INTRAVENOUS | Status: AC | PRN
  Administered 2016-12-19: 20 mL via INTRAVENOUS

## 2016-12-22 ENCOUNTER — Ambulatory Visit
Admission: RE | Admit: 2016-12-22 | Discharge: 2016-12-22 | Disposition: A | Discharge status: Home / Self Care | Payer: Medicare Other | Source: Ambulatory Visit | Attending: Family Medicine | Admitting: Family Medicine

## 2016-12-22 DIAGNOSIS — Z1231 Encounter for screening mammogram for malignant neoplasm of breast: Secondary | ICD-10-CM

## 2016-12-25 ENCOUNTER — Telehealth (HOSPITAL_COMMUNITY): Payer: Self-pay | Admitting: Radiology

## 2016-12-25 NOTE — Telephone Encounter (Signed)
Called pt, discussed having her two remaining aneurysms treated. Pt wants me to send a request for her insurance authorization and once I get an authorization back to call her to proceed with scheduling. JM

## 2017-02-23 ENCOUNTER — Other Ambulatory Visit (HOSPITAL_COMMUNITY): Payer: Self-pay | Admitting: Interventional Radiology

## 2017-02-23 DIAGNOSIS — I671 Cerebral aneurysm, nonruptured: Secondary | ICD-10-CM

## 2017-03-18 ENCOUNTER — Other Ambulatory Visit: Payer: Self-pay | Admitting: Radiology

## 2017-03-19 NOTE — Pre-Procedure Instructions (Signed)
Meredith [name]  03/19/2017      [id number] [place], [place] - [address] [facility] [id number] [place] [address] Phone: [phone] Fax: [phone]    Your procedure is scheduled on March 25, 2017.  [facility] AM.  Call this number if you have problems the morning of [phone]   Remember:  Do not eat food or drink liquids after midnight.  Take these medicines the morning of surgery with A SIP OF WATER albuterol (proventil HFA) inhaler <bring inhaler with you>, azelastine(Astelin 0.1%) nasal spray, levothyroxine (synthroid).  Stop Plavix as instructed by your surgeon  7 days prior to surgery STOP taking any Aspirin, Aleve, Naproxen, Ibuprofen, Motrin, Advil, Goody's, BC's, all herbal medications, fish oil, and all vitamins   Do not wear jewelry, make-up or nail polish.  Do not wear lotions, powders, or perfumes, or deoderant.  Do not shave 48 hours prior to surgery.    Do not bring valuables to the hospital.  [facility] is not responsible for any belongings or valuables.  Contacts, dentures or bridgework may not be worn into surgery.  Leave your suitcase in the car.  After surgery it may be brought to your room.  For patients admitted to the hospital, discharge time will be determined by your treatment team.  Patients discharged the day of surgery will not be allowed to drive home.   Special instructions:   [place]- Preparing For Surgery  Before surgery, you can play an important role. Because skin is not sterile, your skin needs to be as free of germs as possible. You can reduce the number of germs on your skin by washing with CHG (chlorahexidine gluconate) Soap before surgery.  CHG is an antiseptic cleaner which kills germs and bonds with the skin to continue killing germs even after washing.  Please do not use if you have an allergy to CHG or antibacterial  soaps. If your skin becomes reddened/irritated stop using the CHG.  Do not shave (including legs and underarms) for at least 48 hours prior to first CHG shower. It is OK to shave your face.  Please follow these instructions carefully.   1. Shower the NIGHT BEFORE SURGERY and the MORNING OF SURGERY with CHG.   2. If you chose to wash your hair, wash your hair first as usual with your normal shampoo.  3. After you shampoo, rinse your hair and body thoroughly to remove the shampoo.  4. Use CHG as you would any other liquid soap. You can apply CHG directly to the skin and wash gently with a scrungie or a clean washcloth.   5. Apply the CHG Soap to your body ONLY FROM THE NECK DOWN.  Do not use on open wounds or open sores. Avoid contact with your eyes, ears, mouth and genitals (private parts). Wash genitals (private parts) with your normal soap.  6. Wash thoroughly, paying special attention to the area where your surgery will be performed.  7. Thoroughly rinse your body with warm water from the neck down.  8. DO NOT shower/wash with your normal soap after using and rinsing off the CHG Soap.  9. Pat yourself dry with a CLEAN TOWEL.   10. Wear CLEAN PAJAMAS   11. Place CLEAN SHEETS on your bed the night of your first shower and DO NOT SLEEP WITH PETS.    Day of Surgery: Do not apply  any deodorants/lotions. Please wear clean clothes to the hospital/surgery center.     Please read over the following fact sheets that you were given. Pain Booklet, Coughing and Deep Breathing and Surgical Site Infection Prevention

## 2017-03-20 ENCOUNTER — Encounter (HOSPITAL_COMMUNITY)
Admission: RE | Admit: 2017-03-20 | Discharge: 2017-03-20 | Disposition: A | Discharge status: Home / Self Care | Payer: Medicare Other | Source: Ambulatory Visit | Attending: Interventional Radiology | Admitting: Interventional Radiology

## 2017-03-20 ENCOUNTER — Encounter (HOSPITAL_COMMUNITY): Payer: Self-pay

## 2017-03-20 DIAGNOSIS — Z01812 Encounter for preprocedural laboratory examination: Secondary | ICD-10-CM | POA: Diagnosis not present

## 2017-03-20 DIAGNOSIS — I729 Aneurysm of unspecified site: Secondary | ICD-10-CM | POA: Insufficient documentation

## 2017-03-20 DIAGNOSIS — I10 Essential (primary) hypertension: Secondary | ICD-10-CM | POA: Insufficient documentation

## 2017-03-20 DIAGNOSIS — Z01818 Encounter for other preprocedural examination: Secondary | ICD-10-CM | POA: Insufficient documentation

## 2017-03-20 LAB — BASIC METABOLIC PANEL
ANION GAP: 8 (ref 5–15)
BUN: 22 mg/dL — ABNORMAL HIGH (ref 6–20)
CALCIUM: 9.6 mg/dL (ref 8.9–10.3)
CHLORIDE: 107 mmol/L (ref 101–111)
CO2: 22 mmol/L (ref 22–32)
CREATININE: 0.69 mg/dL (ref 0.44–1.00)
GFR calc non Af Amer: >60 mL/min (ref >60)
Glucose, Bld: 97 mg/dL (ref 65–99)
Potassium: 3.4 mmol/L — ABNORMAL LOW (ref 3.5–5.1)
SODIUM: 137 mmol/L (ref 135–145)

## 2017-03-20 LAB — CBC WITH DIFFERENTIAL/PLATELET
BASOS PCT: 0 %
Basophils Absolute: 0 10*3/uL (ref 0.0–0.1)
EOS ABS: 0 10*3/uL (ref 0.0–0.7)
Eosinophils Relative: 1 %
HEMATOCRIT: 38.3 % (ref 36.0–46.0)
HEMOGLOBIN: 12.5 g/dL (ref 12.0–15.0)
Lymphocytes Relative: 33 %
Lymphs Abs: 1.1 10*3/uL (ref 0.7–4.0)
MCH: 29.7 pg (ref 26.0–34.0)
MCHC: 32.6 g/dL (ref 30.0–36.0)
MCV: 91 fL (ref 78.0–100.0)
MONOS PCT: 6 %
Monocytes Absolute: 0.2 10*3/uL (ref 0.1–1.0)
NEUTROS PCT: 60 %
Neutro Abs: 2 10*3/uL (ref 1.7–7.7)
Platelets: 174 10*3/uL (ref 150–400)
RBC: 4.21 MIL/uL (ref 3.87–5.11)
RDW: 13.2 % (ref 11.5–15.5)
WBC: 3.4 10*3/uL — AB (ref 4.0–10.5)

## 2017-03-20 LAB — PROTIME-INR
INR: 1.01
PROTHROMBIN TIME: 13.3 s (ref 11.4–15.2)

## 2017-03-20 LAB — PLATELET INHIBITION P2Y12: PLATELET FUNCTION P2Y12: 183 [PRU] — AB (ref 194–418)

## 2017-03-20 LAB — APTT: aPTT: 31 seconds (ref 24–36)

## 2017-03-20 NOTE — Progress Notes (Signed)
PCP is Dr. Lupe Carneyean Mitchell  Denies ever seeing a cardiologist. Denies ever having a card cath, echo, or stress test. States she started taking Plavix on 03-15-17 Denies any chest pain, fever. States she has a  Non productive cough that is allergy related.

## 2017-03-20 NOTE — Pre-Procedure Instructions (Signed)
Meredith RuskFrances L [name]  03/20/2017      [id number] - [place], [place] - [address] [facility] [id number] [place] [address] Phone: [phone] Fax: [phone]    Your procedure is scheduled on March 25, 2017.  [facility] at 700 AM.  Call this number if you have problems the morning of surgery:[phone]   Remember:  Do not eat food or drink liquids after midnight.  Take these medicines the morning of surgery with A SIP OF WATER albuterol (proventil HFA) inhaler <bring inhaler with you>, azelastine(Astelin 0.1%) nasal spray, levothyroxine (synthroid), Aspirin, Plavix     7 days prior to surgery STOP taking any  Aleve, Naproxen, Ibuprofen, Motrin, Advil, Goody's, BC's, all herbal medications, fish oil, and all vitamins   Do not wear jewelry, make-up or nail polish.  Do not wear lotions, powders, or perfumes, or deoderant.  Do not shave 48 hours prior to surgery.    Do not bring valuables to the hospital.  [facility] is not responsible for any belongings or valuables.  Contacts, dentures or bridgework may not be worn into surgery.  Leave your suitcase in the car.  After surgery it may be brought to your room.  For patients admitted to the hospital, discharge time will be determined by your treatment team.  Patients discharged the day of surgery will not be allowed to drive home.   Special instructions:   [place]- Preparing For Surgery  Before surgery, you can play an important role. Because skin is not sterile, your skin needs to be as free of germs as possible. You can reduce the number of germs on your skin by washing with CHG (chlorahexidine gluconate) Soap before surgery.  CHG is an antiseptic cleaner which kills germs and bonds with the skin to continue killing germs even after washing.  Please do not use if you have an allergy to CHG or antibacterial soaps. If your skin becomes  reddened/irritated stop using the CHG.  Do not shave (including legs and underarms) for at least 48 hours prior to first CHG shower. It is OK to shave your face.  Please follow these instructions carefully.   1. Shower the NIGHT BEFORE SURGERY and the MORNING OF SURGERY with CHG.   2. If you chose to wash your hair, wash your hair first as usual with your normal shampoo.  3. After you shampoo, rinse your hair and body thoroughly to remove the shampoo.  4. Use CHG as you would any other liquid soap. You can apply CHG directly to the skin and wash gently with a scrungie or a clean washcloth.   5. Apply the CHG Soap to your body ONLY FROM THE NECK DOWN.  Do not use on open wounds or open sores. Avoid contact with your eyes, ears, mouth and genitals (private parts). Wash genitals (private parts) with your normal soap.  6. Wash thoroughly, paying special attention to the area where your surgery will be performed.  7. Thoroughly rinse your body with warm water from the neck down.  8. DO NOT shower/wash with your normal soap after using and rinsing off the CHG Soap.  9. Pat yourself dry with a CLEAN TOWEL.   10. Wear CLEAN PAJAMAS   11. Place CLEAN SHEETS on your bed the night of your first shower and DO NOT SLEEP WITH PETS.    Day of Surgery: Do not apply any deodorants/lotions. Please  wear clean clothes to the hospital/surgery center.     Please read over the following fact sheets that you were given. Pain Booklet, Coughing and Deep Breathing and Surgical Site Infection Prevention

## 2017-03-24 ENCOUNTER — Other Ambulatory Visit: Payer: Self-pay | Admitting: General Surgery

## 2017-03-25 ENCOUNTER — Ambulatory Visit (HOSPITAL_COMMUNITY): Payer: Medicare Other | Admitting: Certified Registered Nurse Anesthetist

## 2017-03-25 ENCOUNTER — Encounter (HOSPITAL_COMMUNITY): Payer: Self-pay | Admitting: Certified Registered Nurse Anesthetist

## 2017-03-25 ENCOUNTER — Encounter (HOSPITAL_COMMUNITY)
Admission: AD | Disposition: A | Discharge status: Home / Self Care | Payer: Self-pay | Source: Ambulatory Visit | Attending: Interventional Radiology

## 2017-03-25 ENCOUNTER — Ambulatory Visit (HOSPITAL_COMMUNITY)
Admission: RE | Admit: 2017-03-25 | Discharge: 2017-03-25 | Disposition: A | Discharge status: Home / Self Care | Payer: Medicare Other | Source: Ambulatory Visit | Attending: Interventional Radiology | Admitting: Interventional Radiology

## 2017-03-25 ENCOUNTER — Encounter (HOSPITAL_COMMUNITY): Payer: Self-pay

## 2017-03-25 ENCOUNTER — Observation Stay (HOSPITAL_COMMUNITY)
Admission: AD | Admit: 2017-03-25 | Discharge: 2017-03-26 | Disposition: A | Discharge status: Home / Self Care | Payer: Medicare Other | Source: Ambulatory Visit | Attending: Interventional Radiology | Admitting: Interventional Radiology

## 2017-03-25 DIAGNOSIS — Z7982 Long term (current) use of aspirin: Secondary | ICD-10-CM | POA: Diagnosis not present

## 2017-03-25 DIAGNOSIS — E039 Hypothyroidism, unspecified: Secondary | ICD-10-CM | POA: Insufficient documentation

## 2017-03-25 DIAGNOSIS — I671 Cerebral aneurysm, nonruptured: Secondary | ICD-10-CM | POA: Diagnosis present

## 2017-03-25 DIAGNOSIS — Z7902 Long term (current) use of antithrombotics/antiplatelets: Secondary | ICD-10-CM | POA: Insufficient documentation

## 2017-03-25 DIAGNOSIS — I739 Peripheral vascular disease, unspecified: Secondary | ICD-10-CM | POA: Diagnosis not present

## 2017-03-25 DIAGNOSIS — I10 Essential (primary) hypertension: Secondary | ICD-10-CM | POA: Insufficient documentation

## 2017-03-25 DIAGNOSIS — M1990 Unspecified osteoarthritis, unspecified site: Secondary | ICD-10-CM | POA: Insufficient documentation

## 2017-03-25 DIAGNOSIS — Z79899 Other long term (current) drug therapy: Secondary | ICD-10-CM | POA: Diagnosis not present

## 2017-03-25 HISTORY — PX: RADIOLOGY WITH ANESTHESIA: SHX6223

## 2017-03-25 HISTORY — PX: IR ANGIO INTRA EXTRACRAN SEL INTERNAL CAROTID UNI R MOD SED: IMG5362

## 2017-03-25 HISTORY — PX: IR TRANSCATH/EMBOLIZ: IMG695

## 2017-03-25 HISTORY — PX: IR ANGIO VERTEBRAL SEL SUBCLAVIAN INNOMINATE UNI R MOD SED: IMG5365

## 2017-03-25 LAB — BPAM PLATELET PHERESIS
Blood Product Expiration Date: 201806132359
ISSUE DATE / TIME: 201806131434
UNIT TYPE AND RH: 5100

## 2017-03-25 LAB — POCT ACTIVATED CLOTTING TIME
ACTIVATED CLOTTING TIME: 202 s
ACTIVATED CLOTTING TIME: 169 s
Activated Clotting Time: 169 seconds
Activated Clotting Time: 263 seconds
Activated Clotting Time: 235 seconds
Activated Clotting Time: 197 seconds

## 2017-03-25 LAB — PREPARE PLATELET PHERESIS: UNIT DIVISION: 0

## 2017-03-25 LAB — PLATELET INHIBITION P2Y12: Platelet Function  P2Y12: 170 [PRU] — ABNORMAL LOW (ref 194–418)

## 2017-03-25 LAB — MRSA PCR SCREENING: MRSA by PCR: NEGATIVE

## 2017-03-25 LAB — TYPE AND SCREEN
ABO/RH(D): A POS
Antibody Screen: NEGATIVE

## 2017-03-25 SURGERY — RADIOLOGY WITH ANESTHESIA
Anesthesia: General

## 2017-03-25 MED ORDER — HEPARIN (PORCINE) IN NACL 100-0.45 UNIT/ML-% IJ SOLN
INTRAMUSCULAR | Status: AC
  Administered 2017-03-25: 500 [IU]/h
  Filled 2017-03-25: qty 250

## 2017-03-25 MED ORDER — IOPAMIDOL (ISOVUE-300) INJECTION 61%
INTRAVENOUS | Status: AC
  Administered 2017-03-25: 60 mL
  Filled 2017-03-25: qty 150

## 2017-03-25 MED ORDER — SUGAMMADEX SODIUM 200 MG/2ML IV SOLN
INTRAVENOUS | Status: DC | PRN
  Administered 2017-03-25: 200 mg via INTRAVENOUS

## 2017-03-25 MED ORDER — SODIUM CHLORIDE 0.9 % IV SOLN: Freq: Once | INTRAVENOUS | Status: DC

## 2017-03-25 MED ORDER — ASPIRIN 325 MG PO TABS
325.0000 mg | ORAL_TABLET | Freq: Every day | ORAL | Status: DC
  Administered 2017-03-26: 325 mg via ORAL
  Filled 2017-03-25: qty 1

## 2017-03-25 MED ORDER — ACETAMINOPHEN 650 MG RE SUPP: 650.0000 mg | RECTAL | Status: DC | PRN

## 2017-03-25 MED ORDER — EPTIFIBATIDE 20 MG/10ML IV SOLN
INTRAVENOUS | Status: AC
  Filled 2017-03-25: qty 10

## 2017-03-25 MED ORDER — PROMETHAZINE HCL 25 MG/ML IJ SOLN: 6.2500 mg | INTRAMUSCULAR | Status: DC | PRN

## 2017-03-25 MED ORDER — HEPARIN (PORCINE) IN NACL 100-0.45 UNIT/ML-% IJ SOLN
500.0000 [IU]/h | INTRAMUSCULAR | Status: DC
  Filled 2017-03-25: qty 250

## 2017-03-25 MED ORDER — HYDROCHLOROTHIAZIDE 25 MG PO TABS
25.0000 mg | ORAL_TABLET | Freq: Every morning | ORAL | Status: DC
  Administered 2017-03-26: 25 mg via ORAL
  Filled 2017-03-25: qty 1

## 2017-03-25 MED ORDER — EPTIFIBATIDE 20 MG/10ML IV SOLN
INTRAVENOUS | Status: DC | PRN
  Administered 2017-03-25 (×4): 1.5 mg via INTRAVENOUS

## 2017-03-25 MED ORDER — CLOPIDOGREL BISULFATE 75 MG PO TABS
ORAL_TABLET | ORAL | Status: AC
  Filled 2017-03-25: qty 2

## 2017-03-25 MED ORDER — CEFAZOLIN SODIUM-DEXTROSE 2-4 GM/100ML-% IV SOLN
2.0000 g | Freq: Once | INTRAVENOUS | Status: AC
  Administered 2017-03-25: 2 g via INTRAVENOUS
  Filled 2017-03-25: qty 100

## 2017-03-25 MED ORDER — CLOPIDOGREL BISULFATE 75 MG PO TABS
150.0000 mg | ORAL_TABLET | Freq: Every day | ORAL | Status: DC
  Administered 2017-03-25: 150 mg via ORAL

## 2017-03-25 MED ORDER — ONDANSETRON HCL 4 MG/2ML IJ SOLN: 4.0000 mg | Freq: Four times a day (QID) | INTRAMUSCULAR | Status: DC | PRN

## 2017-03-25 MED ORDER — HYDROMORPHONE HCL 1 MG/ML IJ SOLN: 0.2500 mg | INTRAMUSCULAR | Status: DC | PRN

## 2017-03-25 MED ORDER — SODIUM CHLORIDE 0.9 % IV SOLN: INTRAVENOUS | Status: DC

## 2017-03-25 MED ORDER — ACETAMINOPHEN 160 MG/5ML PO SOLN: 650.0000 mg | ORAL | Status: DC | PRN

## 2017-03-25 MED ORDER — FENTANYL CITRATE (PF) 100 MCG/2ML IJ SOLN
INTRAMUSCULAR | Status: DC | PRN
  Administered 2017-03-25: 25 ug via INTRAVENOUS
  Administered 2017-03-25 (×2): 50 ug via INTRAVENOUS
  Administered 2017-03-25: 25 ug via INTRAVENOUS
  Administered 2017-03-25: 50 ug via INTRAVENOUS

## 2017-03-25 MED ORDER — IOPAMIDOL (ISOVUE-300) INJECTION 61%
INTRAVENOUS | Status: AC
  Administered 2017-03-25: 40 mL
  Filled 2017-03-25: qty 100

## 2017-03-25 MED ORDER — HEPARIN (PORCINE) IN NACL 100-0.45 UNIT/ML-% IJ SOLN
550.0000 [IU]/h | INTRAMUSCULAR | Status: DC
  Administered 2017-03-25: 550 [IU]/h via INTRAVENOUS
  Filled 2017-03-25: qty 250

## 2017-03-25 MED ORDER — SODIUM CHLORIDE 0.9 % IV SOLN
INTRAVENOUS | Status: DC
  Administered 2017-03-25 (×2): via INTRAVENOUS

## 2017-03-25 MED ORDER — DEXAMETHASONE SODIUM PHOSPHATE 10 MG/ML IJ SOLN
INTRAMUSCULAR | Status: DC | PRN
  Administered 2017-03-25: 10 mg via INTRAVENOUS

## 2017-03-25 MED ORDER — LIDOCAINE HCL 1 % IJ SOLN
INTRAMUSCULAR | Status: DC | PRN
  Administered 2017-03-25: 20 mL

## 2017-03-25 MED ORDER — ACETAMINOPHEN 325 MG PO TABS: 650.0000 mg | ORAL_TABLET | ORAL | Status: DC | PRN

## 2017-03-25 MED ORDER — PROTAMINE SULFATE 10 MG/ML IV SOLN
INTRAVENOUS | Status: DC | PRN
  Administered 2017-03-25: 5 mg via INTRAVENOUS
  Administered 2017-03-25: 2.5 mg via INTRAVENOUS
  Administered 2017-03-25 (×2): 5 mg via INTRAVENOUS

## 2017-03-25 MED ORDER — ONDANSETRON HCL 4 MG/2ML IJ SOLN
INTRAMUSCULAR | Status: DC | PRN
  Administered 2017-03-25: 4 mg via INTRAVENOUS

## 2017-03-25 MED ORDER — NIMODIPINE 30 MG PO CAPS
60.0000 mg | ORAL_CAPSULE | ORAL | Status: AC
  Administered 2017-03-25: 60 mg via ORAL
  Filled 2017-03-25: qty 2

## 2017-03-25 MED ORDER — LIDOCAINE HCL 1 % IJ SOLN
INTRAMUSCULAR | Status: AC
  Filled 2017-03-25: qty 20

## 2017-03-25 MED ORDER — CLOPIDOGREL BISULFATE 75 MG PO TABS: 75.0000 mg | ORAL_TABLET | Freq: Every day | ORAL | Status: DC

## 2017-03-25 MED ORDER — MIDAZOLAM HCL 5 MG/5ML IJ SOLN
INTRAMUSCULAR | Status: DC | PRN
  Administered 2017-03-25: 1 mg via INTRAVENOUS

## 2017-03-25 MED ORDER — LEVOTHYROXINE SODIUM 200 MCG PO TABS
200.0000 ug | ORAL_TABLET | Freq: Every day | ORAL | Status: DC
  Administered 2017-03-26: 200 ug via ORAL
  Filled 2017-03-25: qty 2
  Filled 2017-03-25: qty 1

## 2017-03-25 MED ORDER — ROCURONIUM BROMIDE 100 MG/10ML IV SOLN
INTRAVENOUS | Status: DC | PRN
  Administered 2017-03-25: 50 mg via INTRAVENOUS
  Administered 2017-03-25 (×2): 10 mg via INTRAVENOUS

## 2017-03-25 MED ORDER — PHENYLEPHRINE HCL 10 MG/ML IJ SOLN
INTRAMUSCULAR | Status: DC | PRN
  Administered 2017-03-25: 20 ug/min via INTRAVENOUS

## 2017-03-25 MED ORDER — CLEVIDIPINE BUTYRATE 0.5 MG/ML IV EMUL
0.0000 mg/h | INTRAVENOUS | Status: DC
  Administered 2017-03-25: 1 mg/h via INTRAVENOUS
  Filled 2017-03-25: qty 50

## 2017-03-25 MED ORDER — NICARDIPINE HCL IN NACL 20-0.86 MG/200ML-% IV SOLN
INTRAVENOUS | Status: DC | PRN
  Administered 2017-03-25: 15:00:00 via INTRAVENOUS
  Administered 2017-03-25: 5 mg/h via INTRAVENOUS

## 2017-03-25 MED ORDER — ALBUTEROL SULFATE (2.5 MG/3ML) 0.083% IN NEBU: 3.0000 mL | INHALATION_SOLUTION | Freq: Four times a day (QID) | RESPIRATORY_TRACT | Status: DC | PRN

## 2017-03-25 MED ORDER — NITROGLYCERIN 1 MG/10 ML FOR IR/CATH LAB
INTRA_ARTERIAL | Status: AC
  Filled 2017-03-25: qty 10

## 2017-03-25 MED ORDER — HEPARIN SODIUM (PORCINE) 1000 UNIT/ML IJ SOLN
INTRAMUSCULAR | Status: DC | PRN
  Administered 2017-03-25 (×2): 1000 [IU] via INTRAVENOUS
  Administered 2017-03-25: 2000 [IU] via INTRAVENOUS

## 2017-03-25 MED ORDER — PROPOFOL 10 MG/ML IV BOLUS
INTRAVENOUS | Status: DC | PRN
  Administered 2017-03-25: 50 mg via INTRAVENOUS
  Administered 2017-03-25: 120 mg via INTRAVENOUS
  Administered 2017-03-25: 30 mg via INTRAVENOUS

## 2017-03-25 MED ORDER — LIDOCAINE HCL (CARDIAC) 20 MG/ML IV SOLN
INTRAVENOUS | Status: DC | PRN
  Administered 2017-03-25: 60 mg via INTRAVENOUS

## 2017-03-25 MED ORDER — EPHEDRINE SULFATE 50 MG/ML IJ SOLN
INTRAMUSCULAR | Status: DC | PRN
  Administered 2017-03-25: 2.5 mg via INTRAVENOUS

## 2017-03-25 NOTE — Progress Notes (Signed)
Patient ID: Meredith RuskFrances L [name], female   DOB: 1946-10-12, 71 y.o.   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  The patient has no complaints.  She denies any HAs or vision changes.    PE: Gen: NAD Skin: R CFA site is without bleeding.  Dressing site is intact.  Right groin is soft Neuro: neuro intact with no changes from this morning.  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  No further bleeding at this time from her right groin site.  Heparin just got started around 1530. Will allow some ice for now in PACU Awaiting bed BP is well-controlled. We will evaluate in the morning and if she is doing well then we will plan for DC home.  Horrace Hanak E 4:04 PM 03/25/2017

## 2017-03-25 NOTE — Anesthesia Procedure Notes (Signed)
Procedure Name: MAC Date/Time: 03/25/2017 9:45 AM Performed by: Candis Shine Pre-anesthesia Checklist: Patient identified, Emergency Drugs available, Suction available and Patient being monitored Patient Re-evaluated:Patient Re-evaluated prior to inductionOxygen Delivery Method: Nasal cannula Dental Injury: Teeth and Oropharynx as per pre-operative assessment

## 2017-03-25 NOTE — Progress Notes (Signed)
Patient ID: Annye RuskFrances L Pardon, female   DOB: 02-01-1946, 71 y.o.   MRN: [id number] INR. Clinically stable.No H/As,or N/V. VSS. Neurologically unchanged. Groin soft. Pulses  1+to 2+ Dps and Pts. Bilaterally. S.Rhylei Mcquaig MD

## 2017-03-25 NOTE — Progress Notes (Signed)
Patient ID: Annye RuskFrances L Legrande, female   DOB: 11/26/1945, 71 y.o.   MRN: [id number] INR. Patient extubated without difficulty/ Denies any H/As,N/V,or visual aberrations . Obeys simple commands appropriately. Moves all 4s equally. Persistent oozing from the Rt groin puncture site necessited use of 17.7 mg of IV protamine sulfate,titrated to ACT results. Most recent ACT of 165. Groin  Pressure held for approx one and a half hours.. Diistal pulses DPs2+ and PTs 1+. Rt groin soft.  S.Shavaun Osterloh MD

## 2017-03-25 NOTE — Transfer of Care (Signed)
Immediate Anesthesia Transfer of Care Note  Patient: Meredith RuskFrances L Pujol  Procedure(s) Performed: Procedure(s): EMBOLIZATION (N/A)  Patient Location: PACU  Anesthesia Type:General  Level of Consciousness: awake, alert  and oriented  Airway & Oxygen Therapy: Patient Spontanous Breathing and Patient connected to nasal cannula oxygen  Post-op Assessment: Report given to RN and Post -op Vital signs reviewed and stable  Post vital signs: Reviewed and stable  Last Vitals:  Vitals:   03/25/17 0728  BP: (!) 165/64  Pulse: 66  Resp: 20  Temp: 36.8 C    Last Pain:  Vitals:   03/25/17 0728  TempSrc: Oral      Patients Stated Pain Goal: 6 (03/25/17 0755)  Complications: No apparent anesthesia complications

## 2017-03-25 NOTE — Sedation Documentation (Signed)
Sheath to right groin removed. V-pad applied 

## 2017-03-25 NOTE — Progress Notes (Signed)
Call placed to IR about if we need to draw a P2Y12 today, states yes to re draw. Order placed.

## 2017-03-25 NOTE — H&P (Signed)
Chief Complaint: Right ICA aneurysm  Supervising Physician: Julieanne Cottoneveshwar, Sanjeev  Patient [facility]  History of Present Illness: Meredith [name] is a 71 y.o. female who is well known to Dr. Corliss Skainseveshwar for previous repair of intracranial aneurysm.  She was seen by Dr. Corliss Skainseveshwar on 12/10/2016 c/o sharp left sided headaches which cause occasional blurred vision.  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  MRI done 12/19/2016 showed = 1. No detected residual flow in the 3 left ophthalmic segment ICA aneurysms after interval pipeline stenting. 2. Unchanged 4 mm right superior hypophyseal region saccular aneurysm. 3. Unchanged 2 mm superiorly directed aneurysm from the right ophthalmic ICA segment.  She is here today for cerebral angiography and repair of the right ICA aneurysm.  She has continued her Plavix as directed.  She is NPO. No fever/chills. She feels well today.  Past Medical History:  Diagnosis Date  . Aneurysm (HCC)   . Arthritis   . Brain aneurysm    have 4 now, last saw dr Marcheta Grammesdevashar 2006, last scan 02-20-2006 epic  . Hypertension   . Hypothyroidism     Past Surgical History:  Procedure Laterality Date  . ANEURYSM COILING   yrs ago   x 1 done  . COLONOSCOPY WITH PROPOFOL N/A 04/11/2014   Procedure: COLONOSCOPY WITH PROPOFOL;  Surgeon: Charolett BumpersMartin K Johnson, MD;  Location: [facility];  Service: Endoscopy;  Laterality: N/A;  . EYE SURGERY Bilateral    Cataracts removed  . IR GENERIC HISTORICAL  12/09/2016   IR RADIOLOGIST EVAL & MGMT  12/09/2016 [facility]INTER[facility] RAD  . JOINT REPLACEMENT Bilateral one in 2007 and one in 2010   knee  . RADIOLOGY WITH ANESTHESIA N/A 08/21/2014   Procedure: Embolization;  Surgeon: Oneal GroutSanjeev K Deveshwar, MD;  Location: [facility] OR;  Service: Radiology;  Laterality: N/A;  . RADIOLOGY WITH ANESTHESIA N/A 08/30/2014   Procedure: EMBOLIZATION;  Surgeon: Oneal GroutSanjeev K Deveshwar, MD;  Location: [facility] OR;  Service: Radiology;  Laterality: N/A;    Allergies: No known allergies  Medications: Prior to Admission medications   Medication Sig Start Date End Date Taking? Authorizing Provider  albuterol (PROVENTIL HFA;VENTOLIN HFA) 108 (90 Base) MCG/ACT inhaler Inhale 2 puffs into the lungs every 6 (six) hours as needed for wheezing or shortness of breath.    [provider]  amoxicillin (AMOXIL) 500 MG capsule Take 2 capsules (1,000 mg total) by mouth 2 (two) times daily. Patient not taking: Reported on 03/17/2017 11/19/14   Dione BoozeGlick, David, MD  aspirin EC 81 MG tablet Take 81 mg by mouth daily. Takes at same time as clopidogrel    [provider]  atorvastatin (LIPITOR) 40 MG tablet Take 40 mg by mouth daily.     [provider]  azelastine (ASTELIN) 0.1 % nasal spray Place 1 spray into both nostrils 2 (two) times daily as needed for rhinitis or allergies. Use in each nostril as directed    [provider]  Calcium Carbonate-Vitamin D3 (CALCIUM 600-D) 600-400 MG-UNIT TABS Take 1 tablet by mouth daily.    [provider]  clopidogrel (PLAVIX) 75  MG tablet Take 75 mg by mouth daily. Takes at same time as 81 mg Aspirin 06/14/14   [provider]  hydrochlorothiazide (HYDRODIURIL) 25 MG tablet Take 25 mg by mouth every morning.    [provider]  levothyroxine (SYNTHROID, LEVOTHROID) 200 MCG tablet Take 200 mcg by mouth daily before breakfast.    [provider]  traMADol (ULTRAM) 50 MG tablet Take 1 tablet (50 mg total) by mouth every 6 (six) hours as needed. Patient  not taking: Reported on 03/17/2017 11/19/14   Dione Booze, MD     Family History  Problem Relation Age of Onset  . Leukemia Mother   . Cancer Sister     Social History   Social History  . Marital status: Widowed    Spouse name: N/A  . Number of children: N/A  . Years of education: N/A   Social History Main Topics  . Smoking status: Never Smoker  . Smokeless tobacco: Never Used  . Alcohol use No  . Drug use: No  . Sexual activity: Not Asked   Other Topics Concern  . None   Social History Narrative  . None   Review of Systems: A 12 point ROS discussed  Review of Systems  Constitutional: Negative.   HENT: Negative.   Eyes: Positive for visual disturbance.  Respiratory: Negative.   Cardiovascular: Negative.   Gastrointestinal: Negative.   Genitourinary: Negative.   Musculoskeletal: Negative.   Skin: Negative.   Neurological: Positive for headaches.  Hematological: Negative.   Psychiatric/Behavioral: Negative.     Vital Signs: 165/64 Pulse 66 Temp 98.3 Resp 20  Physical Exam  Constitutional: She is oriented to person, place, and time. She appears well-developed and well-nourished.  HENT:  Head: Normocephalic and atraumatic.  Eyes: EOM are normal.  Neck: Normal range of motion.  Cardiovascular: Normal rate, regular rhythm and normal heart sounds.   Pulmonary/Chest: Effort normal and breath sounds normal. No respiratory distress. She has no wheezes.  Abdominal: Soft. She exhibits no distension. There is no tenderness.  Neurological: She is alert and oriented to person, place, and time. No cranial nerve deficit.  Skin: Skin is warm and dry.  Psychiatric: She has a normal mood and affect. Her behavior is normal. Judgment and thought content normal.  Vitals reviewed.   Mallampati Score:  MD Evaluation Airway: WNL Heart: WNL Abdomen: WNL Chest/ Lungs: WNL ASA  Classification: 2 Mallampati/Airway Score: One  Imaging: No results  found.  Labs:  CBC:  Recent Labs  03/20/17 0921  WBC 3.4*  HGB 12.5  HCT 38.3  PLT 174    COAGS:  Recent Labs  03/20/17 0921  INR 1.01  APTT 31    BMP:  Recent Labs  12/19/16 1210 03/20/17 0921  NA  --  137  K  --  3.4*  CL  --  107  CO2  --  22  GLUCOSE  --  97  BUN  --  22*  CALCIUM  --  9.6  CREATININE 0.70 0.69  GFRNONAA  --  >60  GFRAA  --  >60    LIVER FUNCTION TESTS: No results for input(s): BILITOT, AST, ALT, ALKPHOS, PROT, ALBUMIN in the last 8760 hours.  TUMOR MARKERS: No results for input(s): AFPTM, CEA, CA199, CHROMGRNA in the last 8760 hours.   Imaging: CLINICAL DATA:  Follow-up pipeline stent.  EXAM: MRI HEAD WITHOUT AND WITH CONTRAST  MRA HEAD WITHOUT CONTRAST  TECHNIQUE: Multiplanar, multiecho pulse sequences of the brain and surrounding  structures were obtained without and with intravenous contrast. Angiographic images of the head were obtained using MRA technique without contrast.  CONTRAST:  20mL MULTIHANCE GADOBENATE DIMEGLUMINE 529 MG/ML IV SOLN  COMPARISON:  05/18/2014  FINDINGS: MRI HEAD FINDINGS  Brain: No acute or nonacute infarction, hemorrhage, hydrocephalus, extra-axial collection or mass lesion. Rare FLAIR hyperintensities in the cerebral white matter. Normal cerebral volume. No abnormal intracranial enhancement. No evidence of inflammation around the aneurysms.  Vascular: Arterial findings below  Skull and upper cervical spine: No concerning marrow lesion. Degenerative change including severe disc degeneration and facet arthropathy at C3-4.  Sinuses/Orbits: Negative  MRA HEAD FINDINGS  Interval pipeline stenting of the left ICA seen from the posterior genu to the para clinoid/ supraclinoid segment. Artifact in this region suppresses signal, but there is no sign of stent occlusion. No concerning signal at the face of the previously coiled ophthalmic segment aneurysm. The 2 other  previously untreated ophthalmic segment aneurysms show no convincing residual flow. Downstream vessels are unremarkable.  Medially and inferiorly directed right ICA aneurysm is size stable at up to 4 mm. This has a wide necked. Finger-like projections superiorly and slightly laterally at the right ICA ophthalmic segment is stable 2 mm.  MCA and ACA branches are unremarkable.  Symmetric vertebral arteries. Smooth vertebral and basilar arteries. Mild atherosclerotic irregularity of distal PCA branches. No noted posterior circulation aneurysm.  IMPRESSION: 1. No detected residual flow in the 3 left ophthalmic segment ICA aneurysms after interval pipeline stenting. 2. Unchanged 4 mm right superior hypophyseal region saccular aneurysm. 3. Unchanged 2 mm superiorly directed aneurysm from the right ophthalmic ICA segment. 4. Unremarkable appearance of the brain parenchyma.   Electronically Signed   By: Marnee Spring M.D.   On: 12/19/2016 14:31   Assessment and Plan:  Right intracranial aneurysm  Will proceed with cerebral angiography and repair of aneurysm today by Dr. Corliss Skains.  Risks and Benefits discussed with the patient including, but not limited to bleeding, infection, vascular injury, contrast induced renal failure, stroke or even death.  All of the patient's questions were answered, patient is agreeable to proceed. Consent signed and in chart.   Electronically Signed: Gwynneth Macleod, PA-C 03/25/2017, 8:13 AM   I spent a total of 25 Minutes in face to face in clinical consultation, greater than 50% of which was counseling/coordinating care for cerebral aneurysm repair.

## 2017-03-25 NOTE — Anesthesia Procedure Notes (Signed)
Procedure Name: Intubation Date/Time: 03/25/2017 10:36 AM Performed by: Candis Shine Pre-anesthesia Checklist: Patient identified, Emergency Drugs available, Suction available and Patient being monitored Patient Re-evaluated:Patient Re-evaluated prior to inductionOxygen Delivery Method: Circle System Utilized Preoxygenation: Pre-oxygenation with 100% oxygen Intubation Type: IV induction Ventilation: Mask ventilation without difficulty and Oral airway inserted - appropriate to patient size Laryngoscope Size: Mac and 3 Grade View: Grade I Tube type: Oral Tube size: 7.0 mm Number of attempts: 1 Airway Equipment and Method: Stylet and Oral airway Placement Confirmation: ETT inserted through vocal cords under direct vision,  positive ETCO2 and breath sounds checked- equal and bilateral Secured at: 22 cm Tube secured with: Tape Dental Injury: Teeth and Oropharynx as per pre-operative assessment

## 2017-03-25 NOTE — Progress Notes (Addendum)
ANTICOAGULATION CONSULT NOTE - Initial Consult  Pharmacy Consult:  Heparin Indication:  Post interventional neuroradiology procedure  Allergies  Allergen Reactions  . No Known Allergies     Patient Measurements: Height: 5\' 2"  (157.5 cm) Weight: 206 lb (93.4 kg) IBW/kg (Calculated) : 50.1 Heparin Dosing Weight: 72 kg  Vital Signs: Temp: 98 F (36.7 C) (06/13 1445) Temp Source: Oral (06/13 0728) BP: 100/54 (06/13 1515) Pulse Rate: 76 (06/13 1515)  Labs: No results for input(s): HGB, HCT, PLT, APTT, LABPROT, INR, HEPARINUNFRC, HEPRLOWMOCWT, CREATININE, CKTOTAL, CKMB, TROPONINI in the last 72 hours.  Estimated Creatinine Clearance: 66.6 mL/min (by C-G formula based on SCr of 0.69 mg/dL).   Medical History: Past Medical History:  Diagnosis Date  . Aneurysm (HCC)   . Arthritis   . Brain aneurysm    have 4 now, last saw dr Marcheta Grammesdevashar 2006, last scan 02-20-2006 epic  . Hypertension   . Hypothyroidism     Assessment: Meredith [name] with right intracranial aneurysm s/p cerebral angiography and repair today 03/25/17.  Heparin to start in the PACU.  Noted documentation of persistent oozing from the right groin puncture site that required protamine administration.  Baseline labs and home meds reviewed.   Goal of Therapy:  Heparin level 0.1 - 0.25 units/ml Monitor platelets by anticoagulation protocol: Yes    Plan:  - Change heparin gtt to 550 units/hr,  - Check 8 hr heparin level - Daily heparin level and CBC x 1 - Monitor closely for s/sx of bleeding    Doy Taaffe D. Laney Potashang, PharmD, BCPS Pager:  [phone] 03/25/2017, 3:34 PM

## 2017-03-25 NOTE — Procedures (Signed)
S/P RT common carotid arteriogram,followed by embolization of intracranial  Rt ICA superior hypophyseal aneurysm using the pipeline flex flow diverter device. RT CFA approach.

## 2017-03-25 NOTE — Anesthesia Preprocedure Evaluation (Addendum)
Anesthesia Evaluation  Patient identified by MRN, date of birth, ID band Patient awake    Reviewed: Allergy & Precautions, NPO status , Patient's Chart, lab work & pertinent test results  History of Anesthesia Complications Negative for: history of anesthetic complications  Airway Mallampati: II   Neck ROM: Full    Dental  (+) Edentulous Upper   Pulmonary neg pulmonary ROS,    breath sounds clear to auscultation       Cardiovascular hypertension, + Peripheral Vascular Disease   Rhythm:Regular Rate:Normal     Neuro/Psych aneurysm    GI/Hepatic   Endo/Other  Hypothyroidism   Renal/GU      Musculoskeletal  (+) Arthritis ,   Abdominal   Peds  Hematology   Anesthesia Other Findings   Reproductive/Obstetrics                             Anesthesia Physical Anesthesia Plan  ASA: III  Anesthesia Plan: General   Post-op Pain Management:    Induction: Intravenous  PONV Risk Score and Plan: 4 or greater and Ondansetron, Propofol and Midazolam  Airway Management Planned:   Additional Equipment: Arterial line  Intra-op Plan:   Post-operative Plan: Extubation in OR  Informed Consent: I have reviewed the patients History and Physical, chart, labs and discussed the procedure including the risks, benefits and alternatives for the proposed anesthesia with the patient or authorized representative who has indicated his/her understanding and acceptance.     Plan Discussed with: CRNA  Anesthesia Plan Comments:        Anesthesia Quick Evaluation

## 2017-03-26 ENCOUNTER — Encounter (HOSPITAL_COMMUNITY): Payer: Self-pay | Admitting: Interventional Radiology

## 2017-03-26 DIAGNOSIS — I671 Cerebral aneurysm, nonruptured: Secondary | ICD-10-CM | POA: Diagnosis not present

## 2017-03-26 LAB — CBC WITH DIFFERENTIAL/PLATELET
Basophils Absolute: 0 10*3/uL (ref 0.0–0.1)
Basophils Relative: 0 %
Eosinophils Absolute: 0 10*3/uL (ref 0.0–0.7)
Eosinophils Relative: 0 %
HEMATOCRIT: 30.8 % — AB (ref 36.0–46.0)
HEMOGLOBIN: 9.9 g/dL — AB (ref 12.0–15.0)
LYMPHS ABS: 0.7 10*3/uL (ref 0.7–4.0)
LYMPHS PCT: 13 %
MCH: 29.2 pg (ref 26.0–34.0)
MCHC: 32.1 g/dL (ref 30.0–36.0)
MCV: 90.9 fL (ref 78.0–100.0)
MONOS PCT: 4 %
Monocytes Absolute: 0.2 10*3/uL (ref 0.1–1.0)
NEUTROS ABS: 4.1 10*3/uL (ref 1.7–7.7)
NEUTROS PCT: 83 %
Platelets: 159 10*3/uL (ref 150–400)
RBC: 3.39 MIL/uL — ABNORMAL LOW (ref 3.87–5.11)
RDW: 13.2 % (ref 11.5–15.5)
WBC: 5 10*3/uL (ref 4.0–10.5)

## 2017-03-26 LAB — BASIC METABOLIC PANEL
ANION GAP: 7 (ref 5–15)
BUN: 9 mg/dL (ref 6–20)
CHLORIDE: 113 mmol/L — AB (ref 101–111)
CO2: 22 mmol/L (ref 22–32)
Calcium: 8.7 mg/dL — ABNORMAL LOW (ref 8.9–10.3)
Creatinine, Ser: 0.56 mg/dL (ref 0.44–1.00)
GFR calc non Af Amer: >60 mL/min (ref >60)
Glucose, Bld: 110 mg/dL — ABNORMAL HIGH (ref 65–99)
Potassium: 3.5 mmol/L (ref 3.5–5.1)
Sodium: 142 mmol/L (ref 135–145)

## 2017-03-26 LAB — PLATELET INHIBITION P2Y12: Platelet Function  P2Y12: 218 [PRU] (ref 194–418)

## 2017-03-26 LAB — HEPARIN LEVEL (UNFRACTIONATED): Heparin Unfractionated: <0.1 IU/mL — ABNORMAL LOW (ref 0.30–0.70)

## 2017-03-26 MED ORDER — CLOPIDOGREL BISULFATE 75 MG PO TABS: 75.0000 mg | ORAL_TABLET | Freq: Once | ORAL | Status: DC

## 2017-03-26 MED ORDER — CLOPIDOGREL BISULFATE 75 MG PO TABS
75.0000 mg | ORAL_TABLET | Freq: Every day | ORAL | Status: DC
  Administered 2017-03-26: 75 mg via ORAL
  Filled 2017-03-26: qty 1

## 2017-03-26 MED ORDER — CLOPIDOGREL BISULFATE 75 MG PO TABS
75.0000 mg | ORAL_TABLET | Freq: Once | ORAL | Status: AC
  Administered 2017-03-26: 75 mg via ORAL

## 2017-03-26 MED ORDER — HEPARIN (PORCINE) IN NACL 100-0.45 UNIT/ML-% IJ SOLN
750.0000 [IU]/h | INTRAMUSCULAR | Status: DC
  Filled 2017-03-26: qty 250

## 2017-03-26 NOTE — Progress Notes (Signed)
1 Day Post-Op   Subjective/Chief Complaint: NO specific complaints. NO chest pains or SOB. Tolerating  full liquids.    Objective: Vital signs in last 24 hours: Temp:  [97.2 F (36.2 C)-98.8 F (37.1 C)] 98.6 F (37 C) (06/14 0804) Pulse Rate:  [61-83] 72 (06/14 0900) Resp:  [11-24] 14 (06/14 0900) BP: (78-140)/(37-104) 133/76 (06/14 0900) SpO2:  [94 %-100 %] 98 % (06/14 0900) Arterial Line BP: (112-195)/(44-89) 183/60 (06/14 0900) Last BM Date: 03/25/17  Intake/Output from previous day: 06/13 0701 - 06/14 0700 In: 2025.9 [I.V.:2025.9] Out: 2790 [Urine:2750; Blood:40] Intake/Output this shift: No intake/output data recorded.   VS  BP 130s /70s. HR 70s SR . PaO2  >95% RA.  Neuro exam..  Alert, awake oriented to time ,place and space.Marland Kitchen.  Speech and comprehension clear.Marland Kitchen.  PEARLA..3mm Rt = Lt   EOMS full..No nystagmus.  Visual Fields.  No Facial asymmetry.  Tongue Midline..  Motor..           NO drift of outstretched arms..          Power.5/5 Proximally and distally all four extremities..  Fine motor and coordination to finger to nose equal..  Gait . Not tested.  Romberg. Not tested.  Heel to toe.. Not tested  Rt groin soft.Pulses  2+ DPs and PTs in both feet.  Lab Results:   Recent Labs  03/26/17 0442  WBC 5.0  HGB 9.9*  HCT 30.8*  PLT 159   BMET  Recent Labs  03/26/17 0442  NA 142  K 3.5  CL 113*  CO2 22  GLUCOSE 110*  BUN 9  CREATININE 0.56  CALCIUM 8.7*   PT/INR No results for input(s): LABPROT, INR in the last 72 hours. ABG No results for input(s): PHART, HCO3 in the last 72 hours.  Invalid input(s): PCO2, PO2  Studies/Results: No results found.  Anti-infectives: Anti-infectives    Start     Dose/Rate Route Frequency Ordered Stop   03/25/17 0730  ceFAZolin (ANCEF) IVPB 2g/100 mL premix     2 g 200 mL/hr over 30 Minutes Intravenous  Once 03/25/17 0725 03/25/17 1108      Assessment/Plan: S/p . 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  Plann . 1D/C IVs and art line. 2.uncrease PO intake. 3.Mobilize with assistance. 4.Give 150 mg plavix and 81 mg aspirin today. 5.D/C to home with family. Instructions given.regarding avoiding  heavy lifting or driving for next 2 weeks. Maintain hydration ,and take her BP meds,,plavix 75 mg per day  And aspirin 81mg  per day. RTC in 2 weeks. Patient  expressed her understanding. Meredith [name], [name] 03/26/2017

## 2017-03-26 NOTE — Care Management Obs Status (Signed)
MEDICARE OBSERVATION STATUS NOTIFICATION   Patient Details  Name: Meredith [name] MRN: [id number] Date of Birth: [birth year]   Medicare Observation Status Notification Given:  Yes    Cherylann ParrClaxton, Tymere Depuy S, RN 03/26/2017, 10:10 AM

## 2017-03-26 NOTE — Care Management CC44 (Signed)
Condition Code 44 Documentation Completed  Patient Details  Name: Annye RuskFrances L Heyden MRN: [id number] Date of Birth: 23-Oct-1945   Condition Code 44 given:    Patient signature on Condition Code 44 notice:    Documentation of 2 MD's agreement:    Code 44 added to claim:       Cherylann ParrClaxton, Quetzali Heinle S, RN 03/26/2017, 10:04 AM

## 2017-03-26 NOTE — Discharge Summary (Signed)
   Patient ID: Meredith [name] MRN: [id number] DOB/AGE: 04-08-1946 71 y.o.  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  The patient tolerated this procedure well.  She had no complaints of HA, vision changes, or N/V following the procedure.  She was kept on her ASA and plavix during her stay.  Her diet was able to be advanced as tolerated.  Her A line and foley were removed the following day and she was mobilized.  She was otherwise stable for dc home.  Consults: None  Discharge Exam: Blood pressure 133/76, pulse 72, temperature 98.6 F (37 C), temperature source Oral, resp. rate 14, height 5\' 2"  (1.575 m), weight 206 lb (93.4 kg), SpO2 98 %. General appearance: alert, cooperative and no distress Resp: clear to auscultation bilaterally Cardio: regular rate and rhythm Neurologic: neuro exam is at baseline with no changes. PERRL, EOMI  Good grip strength, equal finger-nose-finger.  Good strength, symmetrical in upper and lower extremities.  +2 pedal pulses bilaterally. Incision/Wound: R CFA site is soft, and no evidence of further oozing.  Disposition: 01-Home or Self Care   Allergies as of 03/26/2017      Reactions   No Known Allergies       Medication List    TAKE these medications   albuterol 108 (90 Base) MCG/ACT inhaler Commonly known as:  PROVENTIL HFA;VENTOLIN HFA Inhale 2 puffs into the lungs every 6 (six) hours as needed for wheezing or shortness of breath.   aspirin EC 81 MG tablet Take 81 mg by mouth daily. Takes at same time as clopidogrel   atorvastatin 40 MG tablet Commonly known  as:  LIPITOR Take 40 mg by mouth daily.   azelastine 0.1 % nasal spray Commonly known as:  ASTELIN Place 1 spray into both nostrils 2 (two) times daily as needed for rhinitis or allergies. Use in each nostril as directed   CALCIUM 600-D 600-400 MG-UNIT Tabs Generic drug:  Calcium Carbonate-Vitamin D3 Take 1 tablet by mouth daily.   clopidogrel 75 MG tablet Commonly known as:  PLAVIX Take 75 mg by mouth daily. Takes at same time as 81 mg Aspirin   hydrochlorothiazide 25 MG tablet Commonly known as:  HYDRODIURIL Take 25 mg by mouth every morning.   levothyroxine 200 MCG tablet Commonly known as:  SYNTHROID, LEVOTHROID Take 200 mcg by mouth daily before breakfast.      Follow-up Information    Julieanne Cottoneveshwar, Sanjeev, MD Follow up in 2 week(s).   Specialty:  Interventional Radiology Why:  Victorino DikeJennifer, our scheduler, will call you with an appointment date and time Contact information: [address] [place] [address] [phone]            Electronically Signed: Barnetta ChapelSBORNE,Jarita Raval E 03/26/2017, 9:34 AM   I have spent Less Than 30 Minutes discharging Meredith [name].

## 2017-03-26 NOTE — Discharge Instructions (Signed)
Angiogram, Care After This sheet gives you information about how to care for yourself after your procedure. Your doctor may also give you more specific instructions. If you have problems or questions, contact your doctor. Follow these instructions at home: Insertion site care  Follow instructions from your doctor about how to take care of your long, thin tube (catheter) insertion area. Make sure you: ? Wash your hands with soap and water before you change your bandage (dressing). If you cannot use soap and water, use hand sanitizer. ? Change your bandage as told by your doctor.  Do not take baths, swim, or use a hot tub until your doctor says it is okay.  You may shower 24-48 hours after the procedure or as told by your doctor. ? Gently wash the area with plain soap and water. ? Pat the area dry with a clean towel. ? Do not rub the area. This may cause bleeding.  Do not apply powder or lotion to the area. Keep the area clean and dry.  Check your insertion area every day for signs of infection. Check for: ? More redness, swelling, or pain. ? Fluid or blood. ? Warmth. ? Pus or a bad smell. Activity  Rest as told by your doctor, usually for 1-2 weeks.  Do not lift anything that is heavier than 10 lbs. (4.5 kg) or as told by your doctor.  Do not drive for 2 weeks.  Do not drive or use heavy machinery while taking prescription pain medicine. General instructions  Go back to your normal activities as told by your doctor, usually in about a week. Ask your doctor what activities are safe for you.  If the insertion area starts to bleed, lie flat and put pressure on the area. If the bleeding does not stop, get help right away. This is an emergency.  Drink enough fluid to keep your pee (urine) clear or pale yellow.  Take over-the-counter and prescription medicines only as told by your doctor.  Keep all follow-up visits as told by your doctor. This is important. Contact a doctor  if:  You have a fever.  You have chills.  You have more redness, swelling, or pain around your insertion area.  You have fluid or blood coming from your insertion area.  The insertion area feels warm to the touch.  You have pus or a bad smell coming from your insertion area.  You have more bruising around the insertion area.  Blood collects in the tissue around the insertion area (hematoma) that may be painful to the touch. Get help right away if:  You have a lot of pain in the insertion area.  The insertion area swells very fast.  The insertion area is bleeding, and the bleeding does not stop after holding steady pressure on the area.  The area near or just beyond the insertion area becomes pale, cool, tingly, or numb. These symptoms may be an emergency. Do not wait to see if the symptoms will go away. Get medical help right away. Call your local emergency services (911 in the [place] Do not drive yourself to the hospital. Summary  After the procedure, it is common to have bruising and tenderness at the long, thin tube insertion area.  After the procedure, it is important to rest and drink plenty of fluids.  Do not take baths, swim, or use a hot tub until your doctor says it is okay to do so. You may shower 24-48 hours after the procedure or  as told by your doctor. °· If the insertion area starts to bleed, lie flat and put pressure on the area. If the bleeding does not stop, get help right away. This is an emergency. °This information is not intended to replace advice given to you by your health care provider. Make sure you discuss any questions you have with your health care provider. °Document Released: 12/26/2008 Document Revised: 09/23/2016 Document Reviewed: 09/23/2016 °Elsevier Interactive Patient Education © 2017 [employer] ° °

## 2017-03-26 NOTE — Progress Notes (Signed)
ANTICOAGULATION CONSULT NOTE - Follow-up Consult  Pharmacy Consult:  Heparin Indication:  Post interventional neuroradiology procedure  Allergies  Allergen Reactions  . No Known Allergies     Patient Measurements: Height: 5\' 2"  (157.5 cm) Weight: 206 lb (93.4 kg) IBW/kg (Calculated) : 50.1 Heparin Dosing Weight: 72 kg  Vital Signs: Temp: 98.2 F (36.8 C) (06/14 0000) Temp Source: Oral (06/14 0000) BP: 124/53 (06/14 0218) Pulse Rate: 74 (06/14 0218)  Labs:  Recent Labs  03/26/17 0137  HEPARINUNFRC <0.10*    Estimated Creatinine Clearance: 66.6 mL/min (by C-G formula based on SCr of 0.69 mg/dL).   Assessment: [age] YOF with right intracranial aneurysm s/p cerebral angiography and repair today 03/25/17.  Heparin to start in the PACU.  Noted documentation of persistent oozing from the right groin puncture site that required protamine administration.  Baseline labs and home meds reviewed.  Heparin level undetectable on 550 units/hr. No issues with line or further bleeding reported per RN.  Goal of Therapy:  Heparin level 0.1 - 0.25 units/ml Monitor platelets by anticoagulation protocol: Yes    Plan:  Increase heparin gtt to 750 units/hr Heparin to be off at 0700 per original orders no more levels needed  Christoper Fabianaron Riven Mabile, PharmD, BCPS Clinical pharmacist, pager [phone] 03/26/2017, 2:30 AM

## 2017-03-26 NOTE — Care Management Note (Signed)
Case Management Note  Patient Details  Name: Meredith [name] MRN: [id number] Date of Birth: Jan 03, 1946  Subjective/Objective:  Pt admitted with anuerysm                   Action/Plan:  PTA independent from home alone. Pts friend will transport pt home.  Pt states she has active PCP and denied barriers to obtaining medications at discharge.  Pt states son will be with her post discharge for the recommended time period per attending   Expected Discharge Date:  03/26/17               Expected Discharge Plan:  Home/Self Care  In-House Referral:     Discharge planning Services  CM Consult  Post Acute Care Choice:    Choice offered to:     DME Arranged:    DME Agency:     HH Arranged:    HH Agency:     Status of Service:  Completed, signed off  If discussed at [employer] Length of Stay Meetings, dates discussed:    Additional Comments:  Cherylann ParrClaxton, Danna Casella S, RN 03/26/2017, 10:05 AM

## 2017-03-26 NOTE — Progress Notes (Signed)
Patient  Wheeled out of the unit at this time.

## 2017-03-27 NOTE — Anesthesia Postprocedure Evaluation (Signed)
Anesthesia Post Note  Patient: Meredith RuskFrances L [name]  Procedure(s) Performed: Procedure(s) (LRB): EMBOLIZATION (N/A)     Patient location during evaluation: PACU Anesthesia Type: General Level of consciousness: awake and alert Pain management: pain level controlled Vital Signs Assessment: post-procedure vital signs reviewed and stable Respiratory status: spontaneous breathing, nonlabored ventilation, respiratory function stable and patient connected to nasal cannula oxygen Cardiovascular status: blood pressure returned to baseline and stable Postop Assessment: no signs of nausea or vomiting Anesthetic complications: no    Last Vitals:  Vitals:   03/26/17 1235 03/26/17 1300  BP:  140/65  Pulse:  (!) 58  Resp:    Temp: 36.4 C     Last Pain:  Vitals:   03/26/17 1235  TempSrc: Oral                 Raela Bohl,JAMES TERRILL

## 2017-03-27 NOTE — Progress Notes (Signed)
Per Dr. Corliss Skainseveshwar, there are no longer any BP parameters to follow, conversation witnessed by PA Barnes & NobleKelly Osbourne.  RN only to give scheduled HCTZ early at this time.

## 2017-03-31 ENCOUNTER — Encounter (HOSPITAL_COMMUNITY): Payer: Self-pay | Admitting: Interventional Radiology

## 2017-03-31 ENCOUNTER — Other Ambulatory Visit (HOSPITAL_COMMUNITY): Payer: Self-pay | Admitting: Interventional Radiology

## 2017-03-31 DIAGNOSIS — I729 Aneurysm of unspecified site: Secondary | ICD-10-CM

## 2017-04-13 ENCOUNTER — Other Ambulatory Visit (HOSPITAL_COMMUNITY): Payer: Self-pay | Admitting: Radiology

## 2017-04-13 ENCOUNTER — Ambulatory Visit (HOSPITAL_COMMUNITY)
Admission: RE | Admit: 2017-04-13 | Discharge: 2017-04-13 | Disposition: A | Discharge status: Home / Self Care | Payer: Medicare Other | Source: Ambulatory Visit | Attending: Interventional Radiology | Admitting: Interventional Radiology

## 2017-04-13 DIAGNOSIS — Z7982 Long term (current) use of aspirin: Secondary | ICD-10-CM | POA: Insufficient documentation

## 2017-04-13 DIAGNOSIS — Z7902 Long term (current) use of antithrombotics/antiplatelets: Secondary | ICD-10-CM | POA: Insufficient documentation

## 2017-04-13 DIAGNOSIS — Z8774 Personal history of (corrected) congenital malformations of heart and circulatory system: Secondary | ICD-10-CM | POA: Insufficient documentation

## 2017-04-13 DIAGNOSIS — I671 Cerebral aneurysm, nonruptured: Secondary | ICD-10-CM

## 2017-04-13 DIAGNOSIS — Z48812 Encounter for surgical aftercare following surgery on the circulatory system: Secondary | ICD-10-CM | POA: Diagnosis not present

## 2017-04-13 DIAGNOSIS — I729 Aneurysm of unspecified site: Secondary | ICD-10-CM

## 2017-04-13 HISTORY — PX: IR RADIOLOGIST EVAL & MGMT: IMG5224

## 2017-04-13 LAB — PLATELET INHIBITION P2Y12: Platelet Function  P2Y12: 127 [PRU] — ABNORMAL LOW (ref 194–418)

## 2017-04-13 NOTE — Progress Notes (Signed)
Patient brought to nurses station for a nurse to check her groin site post procedure from 2 weeks ago. Has been wearing a bandaid over it. Has open area in groin fold below puncture site around one inch long 1/8 inch wide. Denies drainage. Area not reddened. Dr Corliss Skainseveshwar aware and in to see patient and examine right groin. He advised patient to keep clean and dry and use a cotton dressing like gauze over it and not a bandaid. He advised patient to seek medical care if starts to have drainage. Also told patient to use Neosporin on site. Patient also aware to seek medical care if are becomes red, swollen, tender. Taken to lab for outpatient lab work.

## 2017-04-14 ENCOUNTER — Encounter (HOSPITAL_COMMUNITY): Payer: Self-pay | Admitting: Interventional Radiology

## 2017-04-23 ENCOUNTER — Telehealth (HOSPITAL_COMMUNITY): Payer: Self-pay | Admitting: *Deleted

## 2017-04-23 NOTE — Telephone Encounter (Signed)
Per Dr. Corliss Skainseveshwar patient is to continue taking Plavix 75mg  a day and ASA 81mg  daily.  Patient voiced understanding

## 2017-08-31 ENCOUNTER — Other Ambulatory Visit (HOSPITAL_COMMUNITY): Payer: Self-pay | Admitting: Interventional Radiology

## 2017-08-31 DIAGNOSIS — I729 Aneurysm of unspecified site: Secondary | ICD-10-CM

## 2017-09-14 ENCOUNTER — Telehealth (HOSPITAL_COMMUNITY): Payer: Self-pay | Admitting: Radiology

## 2017-09-14 NOTE — Telephone Encounter (Signed)
Pt called and states that she does not want to have any follow-up at all. She says "as long as she is feeling good she does not need any follow-up." I encouraged her to continue her follow-ups but she refuses. I told her to call us back should she change her mind. She agreed to do so. JM

## 2017-11-18 ENCOUNTER — Other Ambulatory Visit: Payer: Self-pay | Admitting: Family Medicine

## 2017-11-18 DIAGNOSIS — Z1231 Encounter for screening mammogram for malignant neoplasm of breast: Secondary | ICD-10-CM

## 2017-12-23 ENCOUNTER — Ambulatory Visit
Admission: RE | Admit: 2017-12-23 | Discharge: 2017-12-23 | Disposition: A | Discharge status: Home / Self Care | Payer: Medicare Other | Source: Ambulatory Visit | Attending: Family Medicine | Admitting: Family Medicine

## 2017-12-23 ENCOUNTER — Ambulatory Visit: Payer: Medicare Other

## 2017-12-23 DIAGNOSIS — Z1231 Encounter for screening mammogram for malignant neoplasm of breast: Secondary | ICD-10-CM

## 2018-04-12 IMAGING — MR MR HEAD WO/W CM
10 of 15 series · 28 of 48 positions shown · IV contrast (multihance)
Comparison: 05/18/2014

CLINICAL DATA: Follow-up pipeline stent.

EXAM:
MRI HEAD WITHOUT AND WITH CONTRAST
MRA HEAD WITHOUT CONTRAST
TECHNIQUE: Multiplanar, multiecho pulse sequences of the brain and surrounding
structures were obtained without and with intravenous contrast.
Angiographic images of the head were obtained using MRA technique
without contrast.
CONTRAST:  20mL MULTIHANCE GADOBENATE DIMEGLUMINE 529 MG/ML IV SOLN

[Series 3: DWI · axial · 3.0mm · 1.09mm/px · z∈[-76,+55]mm · 5 of 90 slices shown (1 of 4)]
[im 1/90]
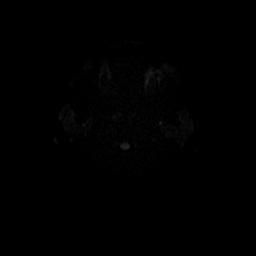
[im 23/90]
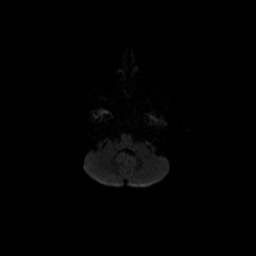
[im 45/90]
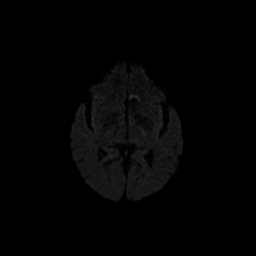
[im 67/90]
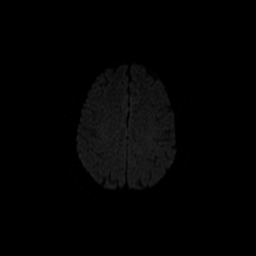
[im 90/90]
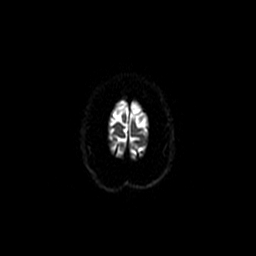

[Series 4: (id) mt fs · axial · 1.4mm · 0.43mm/px · z∈[-85,-19]mm · 5 of 152 slices shown]
[im 1/152]
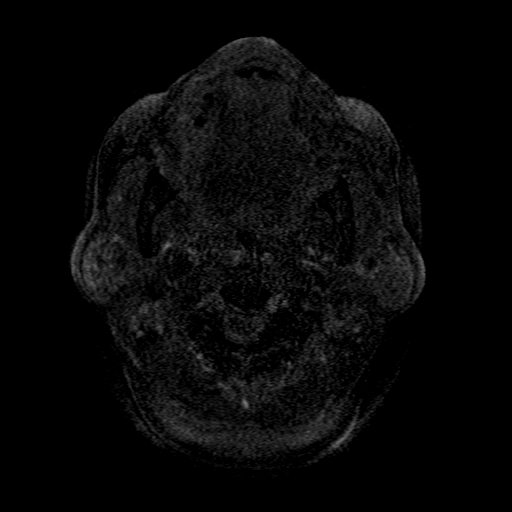
[im 19/152]
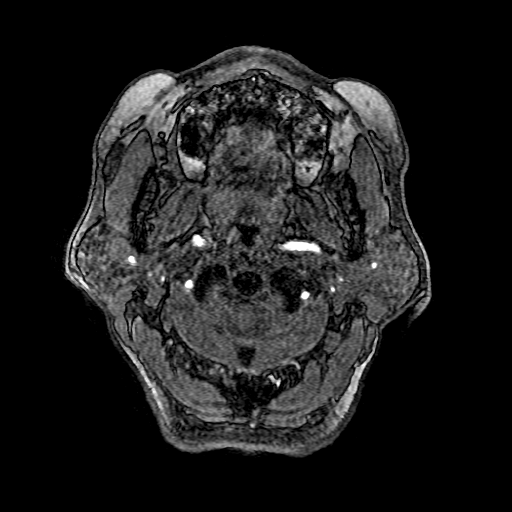
[im 38/152]
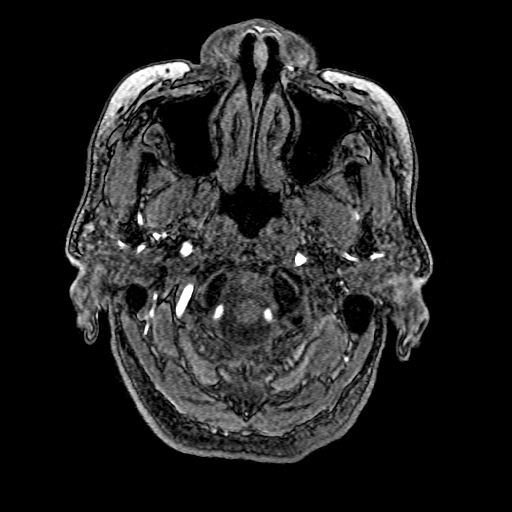
[im 57/152]
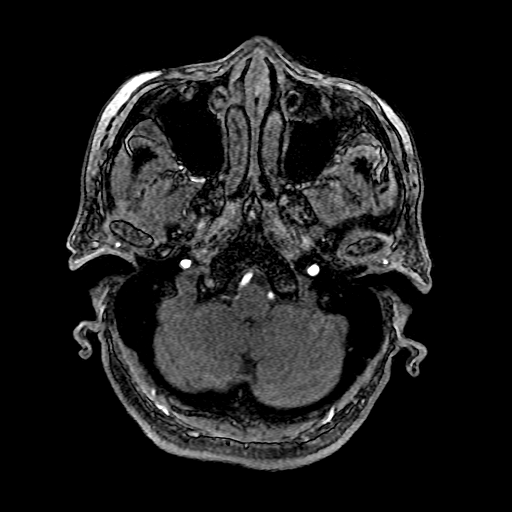
[im 95/152]
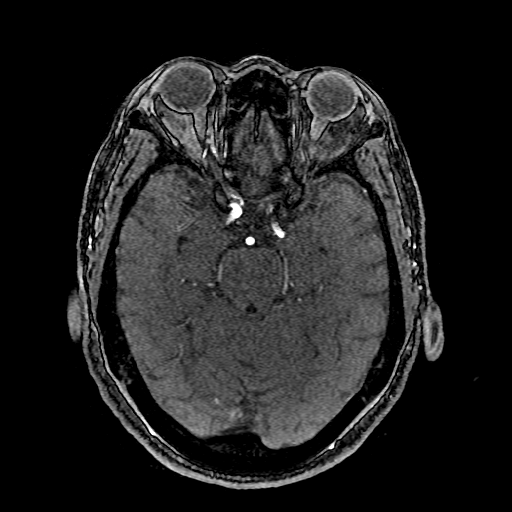

[Series 5: T1 · sagittal · 5.0mm · 0.47mm/px · 1 of 23 slices shown]
[im 1/23]
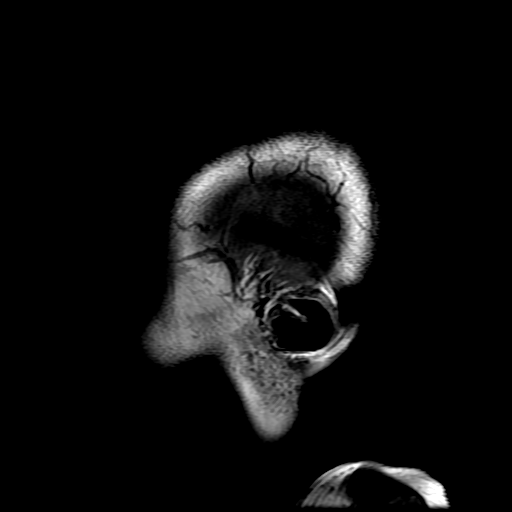

[Series 6: T2 · axial · 5.0mm · 0.43mm/px · z∈[-74,+57]mm · 2 of 23 slices shown (1 of 2)]
[im 1/23]
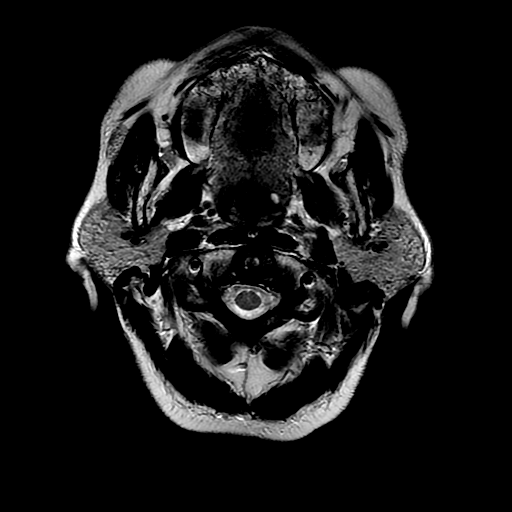
[im 23/23]
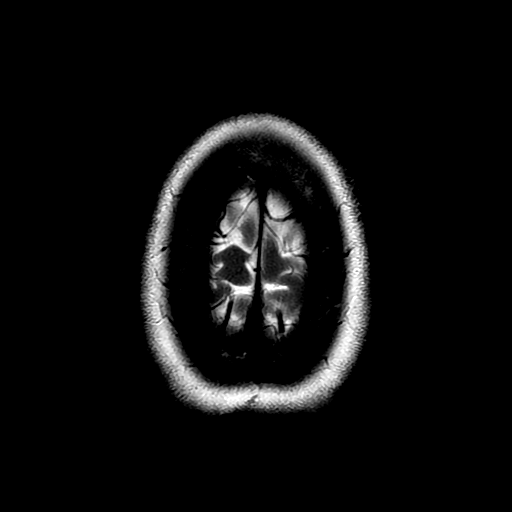

[Series 7: DWI · coronal · 5.0mm · 1.09mm/px · 4 of 64 slices shown (2 of 4)]
[im 1/64]
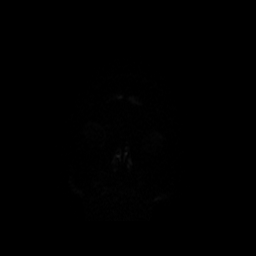
[im 22/64]
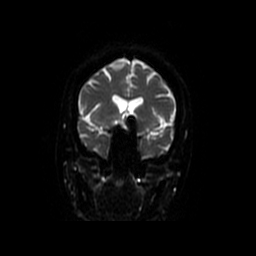
[im 43/64]
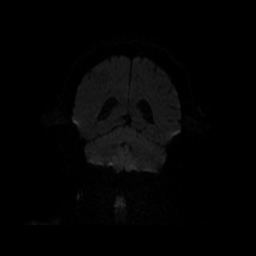
[im 64/64]
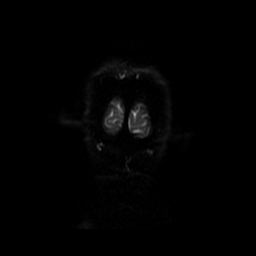

[Series 8: FLAIR · axial · 5.0mm · 0.43mm/px · z∈[-74,+57]mm · 2 of 23 slices shown]
[im 1/23]
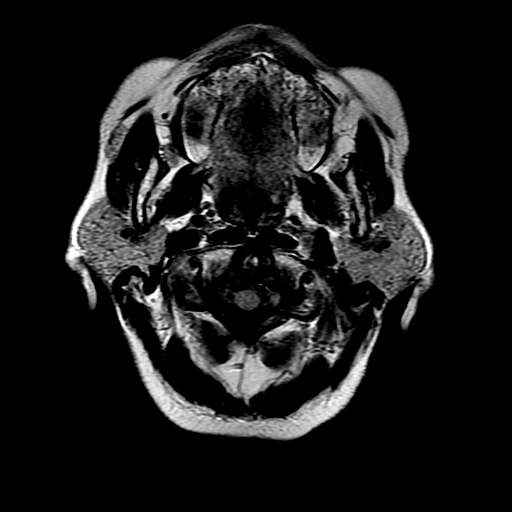
[im 23/23]
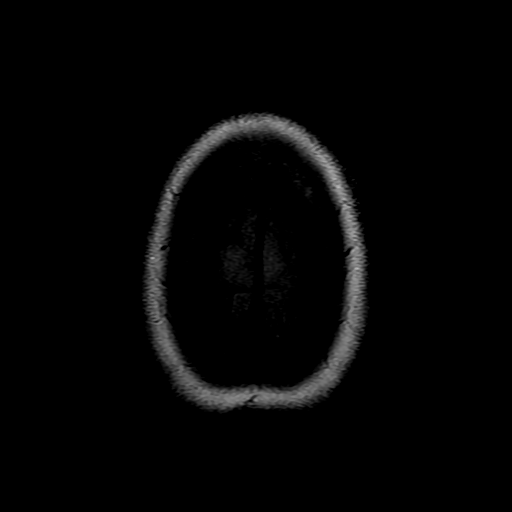

[Series 11: T2 · coronal · 5.0mm · 0.39mm/px · 2 of 25 slices shown (2 of 2)]
[im 1/25]
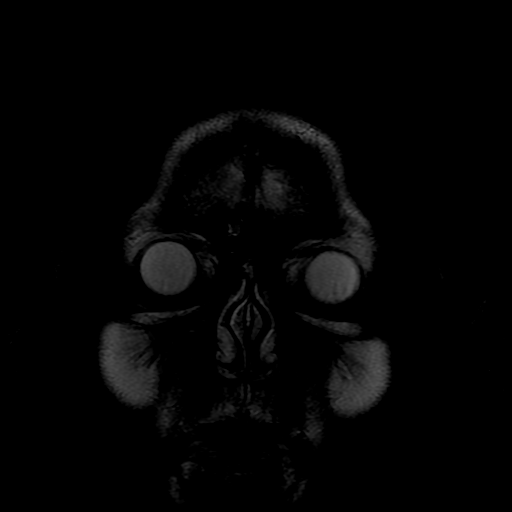
[im 25/25]
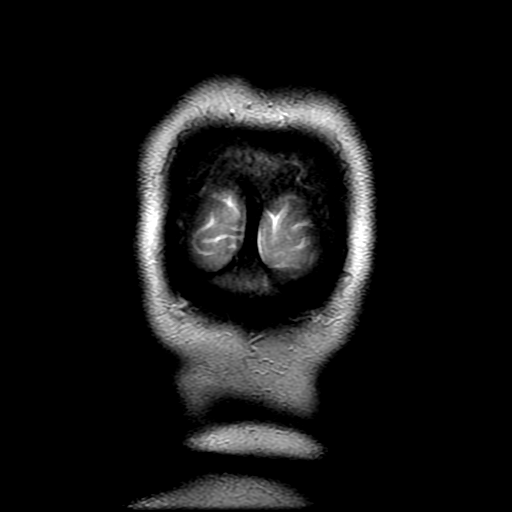

[Series 13: T1 post-contrast · coronal · 5.0mm · 0.39mm/px · 2 of 25 slices shown]
[im 1/25]
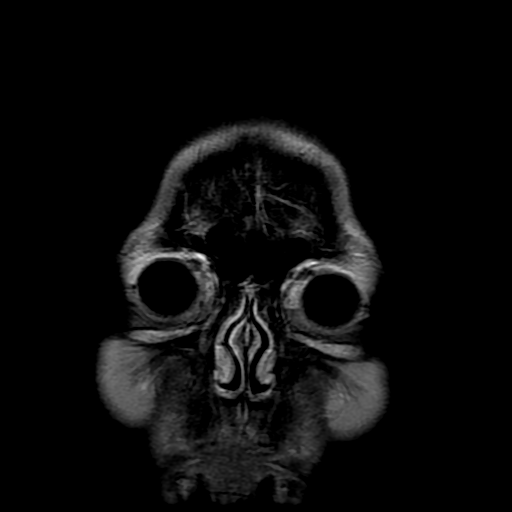
[im 25/25]
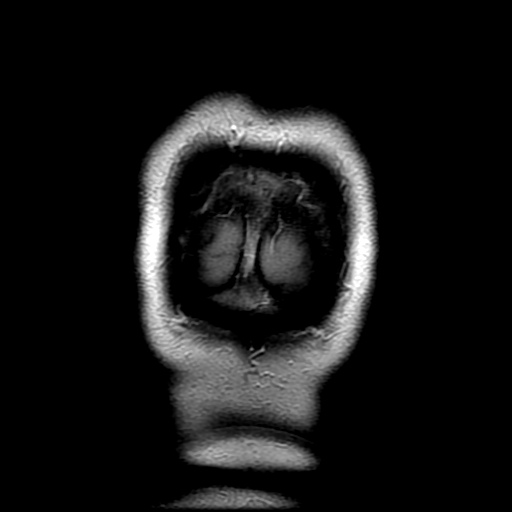

[Series 300: DWI · axial · 3.0mm · 1.09mm/px · z∈[-76,+55]mm · 3 of 45 slices shown (3 of 4)]
[im 1/45]
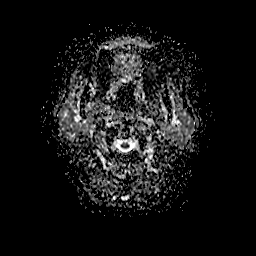
[im 23/45]
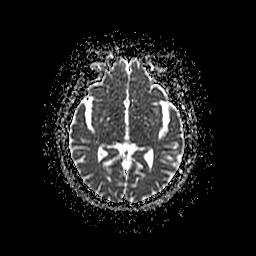
[im 45/45]
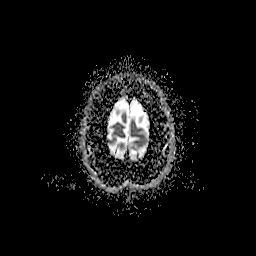

[Series 700: DWI · coronal · 5.0mm · 1.09mm/px · 2 of 32 slices shown (4 of 4)]
[im 1/32]
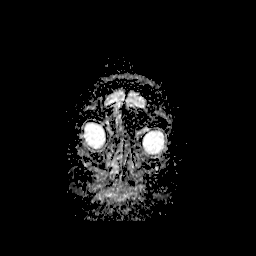
[im 32/32]
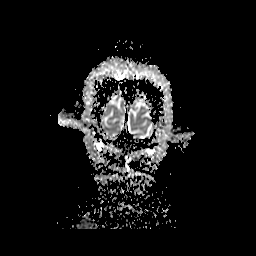

[28 of 48 positions shown; findings below may reference images not displayed]

FINDINGS: MRI HEAD FINDINGS

Brain: No acute or nonacute infarction, hemorrhage, hydrocephalus,
extra-axial collection or mass lesion. Rare FLAIR hyperintensities
in the cerebral white matter. Normal cerebral volume. No abnormal
intracranial enhancement. No evidence of inflammation around the
aneurysms.

Vascular: Arterial findings below

Skull and upper cervical spine: No concerning marrow lesion.
Degenerative change including severe disc degeneration and facet
arthropathy at C3-4.

Sinuses/Orbits: Negative

MRA HEAD FINDINGS

Interval pipeline stenting of the left ICA seen from the posterior
genu to the para clinoid/ supraclinoid segment. Artifact in this
region suppresses signal, but there is no sign of stent occlusion.
No concerning signal at the face of the previously coiled ophthalmic
segment aneurysm. The 2 other previously untreated ophthalmic
segment aneurysms show no convincing residual flow. Downstream
vessels are unremarkable.

Medially and inferiorly directed right ICA aneurysm is size stable
at up to 4 mm. This has a wide necked. Finger-like projections
superiorly and slightly laterally at the right ICA ophthalmic
segment is stable 2 mm.

MCA and ACA branches are unremarkable.

Symmetric vertebral arteries. Smooth vertebral and basilar arteries.
Mild atherosclerotic irregularity of distal PCA branches. No noted
posterior circulation aneurysm.
IMPRESSION: 1. No detected residual flow in the 3 left ophthalmic segment ICA
aneurysms after interval pipeline stenting.
2. Unchanged 4 mm right superior hypophyseal region saccular
aneurysm.
3. Unchanged 2 mm superiorly directed aneurysm from the right
ophthalmic ICA segment.
4. Unremarkable appearance of the brain parenchyma.

## 2018-11-15 ENCOUNTER — Other Ambulatory Visit: Payer: Self-pay | Admitting: Family Medicine

## 2018-11-15 DIAGNOSIS — Z1231 Encounter for screening mammogram for malignant neoplasm of breast: Secondary | ICD-10-CM

## 2018-12-27 ENCOUNTER — Other Ambulatory Visit: Payer: Self-pay

## 2018-12-27 ENCOUNTER — Ambulatory Visit
Admission: RE | Admit: 2018-12-27 | Discharge: 2018-12-27 | Disposition: A | Discharge status: Home / Self Care | Payer: Medicare Other | Source: Ambulatory Visit | Attending: Family Medicine | Admitting: Family Medicine

## 2018-12-27 DIAGNOSIS — Z1231 Encounter for screening mammogram for malignant neoplasm of breast: Secondary | ICD-10-CM

## 2019-11-18 ENCOUNTER — Other Ambulatory Visit: Payer: Self-pay | Admitting: Family Medicine

## 2019-11-18 DIAGNOSIS — Z1231 Encounter for screening mammogram for malignant neoplasm of breast: Secondary | ICD-10-CM

## 2019-12-29 ENCOUNTER — Ambulatory Visit
Admission: RE | Admit: 2019-12-29 | Discharge: 2019-12-29 | Disposition: A | Discharge status: Home / Self Care | Payer: Medicare PPO | Source: Ambulatory Visit | Attending: Family Medicine | Admitting: Family Medicine

## 2019-12-29 ENCOUNTER — Other Ambulatory Visit: Payer: Self-pay

## 2019-12-29 DIAGNOSIS — Z1231 Encounter for screening mammogram for malignant neoplasm of breast: Secondary | ICD-10-CM

## 2019-12-30 ENCOUNTER — Other Ambulatory Visit: Payer: Self-pay | Admitting: Family Medicine

## 2019-12-30 DIAGNOSIS — M8580 Other specified disorders of bone density and structure, unspecified site: Secondary | ICD-10-CM

## 2020-01-02 ENCOUNTER — Other Ambulatory Visit: Payer: Self-pay | Admitting: Family Medicine

## 2020-01-02 DIAGNOSIS — R928 Other abnormal and inconclusive findings on diagnostic imaging of breast: Secondary | ICD-10-CM

## 2020-01-18 ENCOUNTER — Other Ambulatory Visit: Payer: Self-pay

## 2020-01-18 ENCOUNTER — Ambulatory Visit
Admission: RE | Admit: 2020-01-18 | Discharge: 2020-01-18 | Disposition: A | Discharge status: Home / Self Care | Payer: Medicare PPO | Source: Ambulatory Visit | Attending: Family Medicine | Admitting: Family Medicine

## 2020-01-18 DIAGNOSIS — R928 Other abnormal and inconclusive findings on diagnostic imaging of breast: Secondary | ICD-10-CM

## 2020-03-13 DIAGNOSIS — Z961 Presence of intraocular lens: Secondary | ICD-10-CM | POA: Diagnosis not present

## 2020-03-15 ENCOUNTER — Other Ambulatory Visit: Payer: Self-pay

## 2020-03-15 ENCOUNTER — Ambulatory Visit
Admission: RE | Admit: 2020-03-15 | Discharge: 2020-03-15 | Disposition: A | Discharge status: Home / Self Care | Payer: Medicare PPO | Source: Ambulatory Visit | Attending: Family Medicine | Admitting: Family Medicine

## 2020-03-15 DIAGNOSIS — M8580 Other specified disorders of bone density and structure, unspecified site: Secondary | ICD-10-CM

## 2020-03-15 DIAGNOSIS — M85852 Other specified disorders of bone density and structure, left thigh: Secondary | ICD-10-CM | POA: Diagnosis not present

## 2020-03-15 DIAGNOSIS — Z780 Asymptomatic menopausal state: Secondary | ICD-10-CM | POA: Diagnosis not present

## 2020-07-02 DIAGNOSIS — E669 Obesity, unspecified: Secondary | ICD-10-CM | POA: Diagnosis not present

## 2020-07-02 DIAGNOSIS — E7800 Pure hypercholesterolemia, unspecified: Secondary | ICD-10-CM | POA: Diagnosis not present

## 2020-07-02 DIAGNOSIS — E039 Hypothyroidism, unspecified: Secondary | ICD-10-CM | POA: Diagnosis not present

## 2020-07-02 DIAGNOSIS — I720 Aneurysm of carotid artery: Secondary | ICD-10-CM | POA: Diagnosis not present

## 2020-07-02 DIAGNOSIS — I10 Essential (primary) hypertension: Secondary | ICD-10-CM | POA: Diagnosis not present

## 2020-07-02 DIAGNOSIS — J309 Allergic rhinitis, unspecified: Secondary | ICD-10-CM | POA: Diagnosis not present

## 2020-07-07 DIAGNOSIS — E785 Hyperlipidemia, unspecified: Secondary | ICD-10-CM | POA: Diagnosis not present

## 2020-07-07 DIAGNOSIS — Z6839 Body mass index (BMI) 39.0-39.9, adult: Secondary | ICD-10-CM | POA: Diagnosis not present

## 2020-07-07 DIAGNOSIS — Z803 Family history of malignant neoplasm of breast: Secondary | ICD-10-CM | POA: Diagnosis not present

## 2020-07-07 DIAGNOSIS — E039 Hypothyroidism, unspecified: Secondary | ICD-10-CM | POA: Diagnosis not present

## 2020-07-07 DIAGNOSIS — I10 Essential (primary) hypertension: Secondary | ICD-10-CM | POA: Diagnosis not present

## 2020-07-07 DIAGNOSIS — R32 Unspecified urinary incontinence: Secondary | ICD-10-CM | POA: Diagnosis not present

## 2020-07-07 DIAGNOSIS — Z7982 Long term (current) use of aspirin: Secondary | ICD-10-CM | POA: Diagnosis not present

## 2020-07-07 DIAGNOSIS — M1990 Unspecified osteoarthritis, unspecified site: Secondary | ICD-10-CM | POA: Diagnosis not present

## 2020-09-10 DIAGNOSIS — E039 Hypothyroidism, unspecified: Secondary | ICD-10-CM | POA: Diagnosis not present

## 2020-12-17 ENCOUNTER — Other Ambulatory Visit: Payer: Self-pay | Admitting: Family Medicine

## 2020-12-17 DIAGNOSIS — Z1231 Encounter for screening mammogram for malignant neoplasm of breast: Secondary | ICD-10-CM

## 2021-01-04 DIAGNOSIS — E7800 Pure hypercholesterolemia, unspecified: Secondary | ICD-10-CM | POA: Diagnosis not present

## 2021-01-04 DIAGNOSIS — I10 Essential (primary) hypertension: Secondary | ICD-10-CM | POA: Diagnosis not present

## 2021-01-04 DIAGNOSIS — E039 Hypothyroidism, unspecified: Secondary | ICD-10-CM | POA: Diagnosis not present

## 2021-01-04 DIAGNOSIS — M8580 Other specified disorders of bone density and structure, unspecified site: Secondary | ICD-10-CM | POA: Diagnosis not present

## 2021-01-04 DIAGNOSIS — Z0000 Encounter for general adult medical examination without abnormal findings: Secondary | ICD-10-CM | POA: Diagnosis not present

## 2021-01-04 DIAGNOSIS — I720 Aneurysm of carotid artery: Secondary | ICD-10-CM | POA: Diagnosis not present

## 2021-02-08 ENCOUNTER — Ambulatory Visit
Admission: RE | Admit: 2021-02-08 | Discharge: 2021-02-08 | Disposition: A | Discharge status: Home / Self Care | Payer: Medicare PPO | Source: Ambulatory Visit | Attending: Family Medicine | Admitting: Family Medicine

## 2021-02-08 ENCOUNTER — Other Ambulatory Visit: Payer: Self-pay

## 2021-02-08 DIAGNOSIS — Z1231 Encounter for screening mammogram for malignant neoplasm of breast: Secondary | ICD-10-CM | POA: Diagnosis not present

## 2021-05-13 DIAGNOSIS — H02052 Trichiasis without entropian right lower eyelid: Secondary | ICD-10-CM | POA: Diagnosis not present

## 2021-05-13 DIAGNOSIS — Z961 Presence of intraocular lens: Secondary | ICD-10-CM | POA: Diagnosis not present

## 2021-07-31 DIAGNOSIS — J309 Allergic rhinitis, unspecified: Secondary | ICD-10-CM | POA: Diagnosis not present

## 2021-07-31 DIAGNOSIS — I720 Aneurysm of carotid artery: Secondary | ICD-10-CM | POA: Diagnosis not present

## 2021-07-31 DIAGNOSIS — E669 Obesity, unspecified: Secondary | ICD-10-CM | POA: Diagnosis not present

## 2021-07-31 DIAGNOSIS — E039 Hypothyroidism, unspecified: Secondary | ICD-10-CM | POA: Diagnosis not present

## 2021-07-31 DIAGNOSIS — I10 Essential (primary) hypertension: Secondary | ICD-10-CM | POA: Diagnosis not present

## 2021-07-31 DIAGNOSIS — M129 Arthropathy, unspecified: Secondary | ICD-10-CM | POA: Diagnosis not present

## 2021-07-31 DIAGNOSIS — E7800 Pure hypercholesterolemia, unspecified: Secondary | ICD-10-CM | POA: Diagnosis not present

## 2021-12-05 ENCOUNTER — Other Ambulatory Visit: Payer: Self-pay | Admitting: Family Medicine

## 2021-12-05 DIAGNOSIS — Z1231 Encounter for screening mammogram for malignant neoplasm of breast: Secondary | ICD-10-CM

## 2022-01-13 DIAGNOSIS — E7800 Pure hypercholesterolemia, unspecified: Secondary | ICD-10-CM | POA: Diagnosis not present

## 2022-01-13 DIAGNOSIS — M8580 Other specified disorders of bone density and structure, unspecified site: Secondary | ICD-10-CM | POA: Diagnosis not present

## 2022-01-13 DIAGNOSIS — Z0000 Encounter for general adult medical examination without abnormal findings: Secondary | ICD-10-CM | POA: Diagnosis not present

## 2022-01-13 DIAGNOSIS — E039 Hypothyroidism, unspecified: Secondary | ICD-10-CM | POA: Diagnosis not present

## 2022-01-13 DIAGNOSIS — I720 Aneurysm of carotid artery: Secondary | ICD-10-CM | POA: Diagnosis not present

## 2022-01-13 DIAGNOSIS — I10 Essential (primary) hypertension: Secondary | ICD-10-CM | POA: Diagnosis not present

## 2022-02-10 ENCOUNTER — Ambulatory Visit
Admission: RE | Admit: 2022-02-10 | Discharge: 2022-02-10 | Disposition: A | Discharge status: Home / Self Care | Payer: Medicare PPO | Source: Ambulatory Visit | Attending: Family Medicine | Admitting: Family Medicine

## 2022-02-10 DIAGNOSIS — Z1231 Encounter for screening mammogram for malignant neoplasm of breast: Secondary | ICD-10-CM | POA: Diagnosis not present

## 2022-06-23 DIAGNOSIS — Z961 Presence of intraocular lens: Secondary | ICD-10-CM | POA: Diagnosis not present

## 2022-07-15 DIAGNOSIS — E669 Obesity, unspecified: Secondary | ICD-10-CM | POA: Diagnosis not present

## 2022-07-15 DIAGNOSIS — I720 Aneurysm of carotid artery: Secondary | ICD-10-CM | POA: Diagnosis not present

## 2022-07-15 DIAGNOSIS — E039 Hypothyroidism, unspecified: Secondary | ICD-10-CM | POA: Diagnosis not present

## 2022-07-15 DIAGNOSIS — I10 Essential (primary) hypertension: Secondary | ICD-10-CM | POA: Diagnosis not present

## 2022-07-15 DIAGNOSIS — E7800 Pure hypercholesterolemia, unspecified: Secondary | ICD-10-CM | POA: Diagnosis not present

## 2022-07-15 DIAGNOSIS — M129 Arthropathy, unspecified: Secondary | ICD-10-CM | POA: Diagnosis not present

## 2022-12-19 ENCOUNTER — Other Ambulatory Visit: Payer: Self-pay | Admitting: Family Medicine

## 2022-12-19 DIAGNOSIS — Z1231 Encounter for screening mammogram for malignant neoplasm of breast: Secondary | ICD-10-CM

## 2023-01-14 DIAGNOSIS — E7800 Pure hypercholesterolemia, unspecified: Secondary | ICD-10-CM | POA: Diagnosis not present

## 2023-01-14 DIAGNOSIS — E039 Hypothyroidism, unspecified: Secondary | ICD-10-CM | POA: Diagnosis not present

## 2023-01-14 DIAGNOSIS — M8580 Other specified disorders of bone density and structure, unspecified site: Secondary | ICD-10-CM | POA: Diagnosis not present

## 2023-01-14 DIAGNOSIS — I10 Essential (primary) hypertension: Secondary | ICD-10-CM | POA: Diagnosis not present

## 2023-01-14 DIAGNOSIS — I720 Aneurysm of carotid artery: Secondary | ICD-10-CM | POA: Diagnosis not present

## 2023-02-17 ENCOUNTER — Ambulatory Visit
Admission: RE | Admit: 2023-02-17 | Discharge: 2023-02-17 | Disposition: A | Discharge status: Home / Self Care | Payer: Medicare PPO | Source: Ambulatory Visit | Attending: Family Medicine | Admitting: Family Medicine

## 2023-02-17 DIAGNOSIS — Z1231 Encounter for screening mammogram for malignant neoplasm of breast: Secondary | ICD-10-CM

## 2023-07-01 DIAGNOSIS — Z961 Presence of intraocular lens: Secondary | ICD-10-CM | POA: Diagnosis not present

## 2023-07-09 DIAGNOSIS — E039 Hypothyroidism, unspecified: Secondary | ICD-10-CM | POA: Diagnosis not present

## 2023-07-09 DIAGNOSIS — Z23 Encounter for immunization: Secondary | ICD-10-CM | POA: Diagnosis not present

## 2023-07-09 DIAGNOSIS — M8580 Other specified disorders of bone density and structure, unspecified site: Secondary | ICD-10-CM | POA: Diagnosis not present

## 2023-07-09 DIAGNOSIS — E7800 Pure hypercholesterolemia, unspecified: Secondary | ICD-10-CM | POA: Diagnosis not present

## 2023-07-09 DIAGNOSIS — I10 Essential (primary) hypertension: Secondary | ICD-10-CM | POA: Diagnosis not present

## 2023-07-09 DIAGNOSIS — Z0000 Encounter for general adult medical examination without abnormal findings: Secondary | ICD-10-CM | POA: Diagnosis not present

## 2023-07-09 DIAGNOSIS — I720 Aneurysm of carotid artery: Secondary | ICD-10-CM | POA: Diagnosis not present

## 2023-07-11 ENCOUNTER — Other Ambulatory Visit: Payer: Self-pay | Admitting: Family Medicine

## 2023-07-11 DIAGNOSIS — Z1231 Encounter for screening mammogram for malignant neoplasm of breast: Secondary | ICD-10-CM

## 2023-07-11 DIAGNOSIS — M8580 Other specified disorders of bone density and structure, unspecified site: Secondary | ICD-10-CM

## 2023-09-14 DIAGNOSIS — E039 Hypothyroidism, unspecified: Secondary | ICD-10-CM | POA: Diagnosis not present

## 2024-02-18 ENCOUNTER — Other Ambulatory Visit: Payer: Self-pay | Admitting: Internal Medicine

## 2024-02-18 ENCOUNTER — Ambulatory Visit
Admission: RE | Admit: 2024-02-18 | Discharge: 2024-02-18 | Disposition: A | Discharge status: Home / Self Care | Payer: Medicare PPO | Source: Ambulatory Visit | Attending: Family Medicine | Admitting: Family Medicine

## 2024-02-18 DIAGNOSIS — M8580 Other specified disorders of bone density and structure, unspecified site: Secondary | ICD-10-CM

## 2024-02-18 DIAGNOSIS — Z1231 Encounter for screening mammogram for malignant neoplasm of breast: Secondary | ICD-10-CM
